# Patient Record
Sex: Female | Born: 1960 | Race: White | Hispanic: No | Smoking: Former smoker
Health system: Southern US, Community
[De-identification: ages and names within clinical notes are randomized; demographics above are authoritative.]

## PROBLEM LIST (undated history)

## (undated) DIAGNOSIS — K219 Gastro-esophageal reflux disease without esophagitis: Secondary | ICD-10-CM

## (undated) DIAGNOSIS — M25569 Pain in unspecified knee: Secondary | ICD-10-CM

## (undated) DIAGNOSIS — K589 Irritable bowel syndrome without diarrhea: Secondary | ICD-10-CM

## (undated) DIAGNOSIS — Q8500 Neurofibromatosis, unspecified: Secondary | ICD-10-CM

## (undated) DIAGNOSIS — N390 Urinary tract infection, site not specified: Secondary | ICD-10-CM

## (undated) HISTORY — DX: Irritable bowel syndrome, unspecified: K58.9

## (undated) HISTORY — PX: BREAST SURGERY: SHX581

## (undated) HISTORY — DX: Gastro-esophageal reflux disease without esophagitis: K21.9

## (undated) HISTORY — DX: Pain in unspecified knee: M25.569

## (undated) HISTORY — PX: ABDOMINAL HYSTERECTOMY: SHX81

---

## 2005-11-16 HISTORY — PX: CHOLECYSTECTOMY: SHX55

## 2007-11-17 HISTORY — PX: BREAST LUMPECTOMY: SHX2

## 2012-09-22 ENCOUNTER — Emergency Department (HOSPITAL_COMMUNITY)
Admission: EM | Admit: 2012-09-22 | Discharge: 2012-09-22 | Discharge status: Against Medical Advice | Payer: Medicaid - Out of State | Attending: Emergency Medicine | Admitting: Emergency Medicine

## 2012-09-22 ENCOUNTER — Emergency Department (HOSPITAL_COMMUNITY): Payer: Medicaid - Out of State

## 2012-09-22 ENCOUNTER — Encounter (HOSPITAL_COMMUNITY): Payer: Self-pay | Admitting: *Deleted

## 2012-09-22 DIAGNOSIS — K56609 Unspecified intestinal obstruction, unspecified as to partial versus complete obstruction: Secondary | ICD-10-CM | POA: Insufficient documentation

## 2012-09-22 DIAGNOSIS — Z8669 Personal history of other diseases of the nervous system and sense organs: Secondary | ICD-10-CM | POA: Insufficient documentation

## 2012-09-22 DIAGNOSIS — F17200 Nicotine dependence, unspecified, uncomplicated: Secondary | ICD-10-CM | POA: Insufficient documentation

## 2012-09-22 HISTORY — DX: Neurofibromatosis, unspecified: Q85.00

## 2012-09-22 LAB — COMPREHENSIVE METABOLIC PANEL
ALT: 16 U/L (ref 0–35)
AST: 16 U/L (ref 0–37)
Albumin: 3.7 g/dL (ref 3.5–5.2)
Alkaline Phosphatase: 81 U/L (ref 39–117)
CO2: 29 mEq/L (ref 19–32)
Chloride: 102 mEq/L (ref 96–112)
GFR calc non Af Amer: >90 mL/min (ref >90)
Potassium: 4 mEq/L (ref 3.5–5.1)
Sodium: 140 mEq/L (ref 135–145)
Total Bilirubin: 0.6 mg/dL (ref 0.3–1.2)

## 2012-09-22 LAB — URINALYSIS, ROUTINE W REFLEX MICROSCOPIC
Glucose, UA: NEGATIVE mg/dL
Hgb urine dipstick: NEGATIVE
Ketones, ur: NEGATIVE mg/dL
Leukocytes, UA: NEGATIVE
Protein, ur: NEGATIVE mg/dL
pH: 5.5 (ref 5.0–8.0)

## 2012-09-22 LAB — CBC WITH DIFFERENTIAL/PLATELET
Basophils Absolute: 0.1 10*3/uL (ref 0.0–0.1)
Basophils Relative: 1 % (ref 0–1)
HCT: 39.7 % (ref 36.0–46.0)
Lymphocytes Relative: 13 % (ref 12–46)
Monocytes Absolute: 0.6 10*3/uL (ref 0.1–1.0)
Neutro Abs: 5.3 10*3/uL (ref 1.7–7.7)
Neutrophils Relative %: 76 % (ref 43–77)
RDW: 13.8 % (ref 11.5–15.5)
WBC: 6.9 10*3/uL (ref 4.0–10.5)

## 2012-09-22 MED ORDER — MORPHINE SULFATE 4 MG/ML IJ SOLN
4.0000 mg | Freq: Once | INTRAMUSCULAR | Status: DC
  Filled 2012-09-22: qty 1

## 2012-09-22 MED ORDER — IOHEXOL 300 MG/ML  SOLN
100.0000 mL | Freq: Once | INTRAMUSCULAR | Status: AC | PRN
  Administered 2012-09-22: 100 mL via INTRAVENOUS

## 2012-09-22 MED ORDER — SODIUM CHLORIDE 0.9 % IV BOLUS (SEPSIS)
1000.0000 mL | Freq: Once | INTRAVENOUS | Status: AC
  Administered 2012-09-22: 1000 mL via INTRAVENOUS

## 2012-09-22 MED ORDER — ONDANSETRON HCL 4 MG/2ML IJ SOLN
4.0000 mg | Freq: Once | INTRAMUSCULAR | Status: AC
  Administered 2012-09-22: 4 mg via INTRAVENOUS
  Filled 2012-09-22: qty 2

## 2012-09-22 NOTE — ED Provider Notes (Signed)
History   This chart was scribed for Kristy Octave, MD by Charolett Bumpers . The patient was seen in room APA07/APA07. Patient's care was started at 1116.   CSN: [id number]  Arrival date & time 09/22/12  1116   First MD Initiated Contact with Patient 09/22/12 1146      Chief Complaint  Patient presents with  . Abdominal Pain    The history is provided by the patient.  Kristy Christian is a 51 y.o. female who presents to the Emergency Department complaining of intermittent, moderate abdominal pain with associated nausea, vomiting and diarrhea that started a few days ago. She describes the abdominal pain as crampy and states she has diarrhea after eating. She also states her urine is dark. She denies any dysuria, back pain, vaginal bleeding or discharge. She reports her abdominal pain feels like when she has a UTI. She reports a h/o abdominal hysterectomy, c-section and cholecystomy. She also has a h/o neurofibromatosis.   Past Medical History  Diagnosis Date  . Neurofibromatosis     Past Surgical History  Procedure Date  . Abdominal hysterectomy   . Breast surgery   . Breast lumpectomy   . Cesarean section   . Cholecystectomy     History reviewed. No pertinent family history.  History  Substance Use Topics  . Smoking status: Current Every Day Smoker  . Smokeless tobacco: Not on file  . Alcohol Use: No    OB History    Grav Para Term Preterm Abortions TAB SAB Ect Mult Living                  Review of Systems A complete 10 system review of systems was obtained and all systems are negative except as noted in the HPI and PMH.   Allergies  Review of patient's allergies indicates no known allergies.  Home Medications  No current outpatient prescriptions on file.  BP 122/69  Pulse 83  Temp 98.3 F (36.8 C) (Oral)  Resp 18  Ht 5\' 5"  (1.651 m)  Wt 175 lb (79.379 kg)  BMI 29.12 kg/m2  SpO2 100%  Physical Exam  Nursing note and vitals  reviewed. Constitutional: She is oriented to person, place, and time. She appears well-developed and well-nourished. No distress.  HENT:  Head: Normocephalic and atraumatic.  Eyes: EOM are normal. Pupils are equal, round, and reactive to light.  Neck: Normal range of motion. Neck supple. No tracheal deviation present.  Cardiovascular: Normal rate.   Pulmonary/Chest: Effort normal. No respiratory distress.  Abdominal: Soft. She exhibits no distension. There is tenderness. There is no rebound and no guarding.       Mild suprapubic tenderness. No CVA tenderness.    Musculoskeletal: Normal range of motion. She exhibits no edema.  Neurological: She is alert and oriented to person, place, and time.  Skin: Skin is warm and dry.       Diffuse fibromas   Psychiatric: She has a normal mood and affect. Her behavior is normal.    ED Course  Procedures (including critical care time)  DIAGNOSTIC STUDIES: Oxygen Saturation is 100% on room air, normal by my interpretation.    COORDINATION OF CARE:  11:50-Discussed planned course of treatment with the patient including IV fluids, pain and nausea medication, a CT of abdomen, blood work and UA, who is agreeable at this time.   12:00-Medication Orders: Sodium chloride 0.9% bolus 1,000 mL-once; Ondansetron (Zofran) injection 4 mg-once; Morphine 4 mg/mL injection 4 mg-once  Labs Reviewed  COMPREHENSIVE METABOLIC PANEL - Abnormal; Notable for the following:    Glucose, Bld 104 (*)     All other components within normal limits  CBC WITH DIFFERENTIAL  LIPASE, BLOOD  URINALYSIS, ROUTINE W REFLEX MICROSCOPIC   Ct Abdomen Pelvis W Contrast  09/22/2012  *RADIOLOGY REPORT*  Clinical Data: Abdominal pain  CT ABDOMEN AND PELVIS WITH CONTRAST  Technique:  Multidetector CT imaging of the abdomen and pelvis was performed following the standard protocol during bolus administration of intravenous contrast.  Contrast: OMNIPAQUE IOHEXOL 300 MG/ML  SOLN   Comparison: None.  Findings: Limited images through the lung bases demonstrate no significant appreciable abnormality. The heart size is within normal limits. No pleural or pericardial effusion.  There is mild intra and extrahepatic biliary ductal dilatation to the level of the ampulla. 11 mm hypodensity  along the vertical portion of the duodenum, inferior to the level of the ampulla. Status post cholecystectomy. Left hepatic lobe atrophy versus surgical change. 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  Otherwise unremarkable liver, spleen, pancreas, adrenal glands. There is a 2 x 1.4 cm area of hypoattenuation  superomedial to the left adrenal gland. This is nonspecific.  Symmetric renal enhancement.  No hydronephrosis or hydroureter.  There is a small bowel obstruction pattern with dilated small bowel loops and air-fluid levels, measuring up to 4.8 cm in diameter. The transition point is at the distal ileum mucosal hyperenhancement and submucosal fat attenuation is noted and (series 2 image 65 for example). Appendix within normal limits. There is anterior abdominal wall laxity however no overt herniation.  Small hiatal hernia. No lymphadenopathy.  There is a small amount of intraperitoneal fluid.  No free intraperitoneal air identified.  Status post hysterectomy.  Nonspecific appearance to the cervix. No adnexal mass.  Numerous cutaneous nodules, in keeping with the history of neurofibromatosis.  No acute osseous finding.  IMPRESSION: Small bowel obstruction with transition at the distal ileum. Etiology of which is uncertain.  The transitioned loop shows mucosal hyperenhancement, therefore may reflect an infectious, inflammatory, or ischemic process. Underlying mass not excluded.  Dilated intra and extrahepatic bile ducts to the level of the ampulla.  This is a nonspecific finding. There is an oval hypoattenuation along the second portion of the duodenum, which may  reflect a mucosal or submucosal cystic or fat containing lesion. Recommend comparison with prior imaging to document stability. Can be further evaluated with liver MRI / MRCP.  Atrophy versus postoperative change of the left hepatic lobe.  Cutaneous neurofibromatosis.  2 x 1.4 cm area of hypoattenuation superomedial to the left adrenal gland is nonspecific.   Original Report Authenticated By: Jearld Lesch, M.D.      No diagnosis found.    MDM  Patient presents with lower bowel pain, nausea, vomiting and diarrhea since yesterday. She reports history of UTI without symptoms.  Urinalysis negative, abdomen soft with minimal suprapubic tenderness.  CT shows possible SBO.  Uncertain in setting of neurofibromatosis.  PAtient states she had a bowel obstruction several years ago in [place], that got better on its own. She is unwilling to stay in the hospital.  D/w Dr. Lovell Sheehan.  Patient states she doesn't want to talk to him and just wants to go home because her kids are in town.  She understands that she is leaving against medical advice.   I personally performed the services described in this documentation, which was scribed in my presence.  The recorded information  has been reviewed and considered.      Kristy Octave, MD 09/22/12 1537

## 2012-09-22 NOTE — ED Notes (Signed)
Pt stated she drove herself to the ed. And is unable to take the morphine.   meds held at this time.   Pt in agreement.  Stated she was also unable to drink 2nd bottle of contrast. Ct notified.

## 2012-09-22 NOTE — ED Notes (Signed)
abd pain, diarrhea,nausea, no vomiting.

## 2012-09-22 NOTE — ED Notes (Signed)
Pt refusing to be admitted to hospital, pt states " I need to see my kids, I haven't seen them since April". The ramifications of refusing medical treatment and admission explained to pt, pt expressed understanding, AMA form explained to and signed by pt.

## 2012-09-26 ENCOUNTER — Emergency Department (HOSPITAL_COMMUNITY): Payer: Medicaid - Out of State

## 2012-09-26 ENCOUNTER — Encounter (HOSPITAL_COMMUNITY): Payer: Self-pay

## 2012-09-26 ENCOUNTER — Inpatient Hospital Stay (HOSPITAL_COMMUNITY)
Admission: EM | Admit: 2012-09-26 | Discharge: 2012-09-27 | DRG: 390 | Disposition: A | Discharge status: Home / Self Care | Payer: Medicaid - Out of State | Attending: General Surgery | Admitting: General Surgery

## 2012-09-26 DIAGNOSIS — Q8500 Neurofibromatosis, unspecified: Secondary | ICD-10-CM | POA: Diagnosis present

## 2012-09-26 DIAGNOSIS — K56609 Unspecified intestinal obstruction, unspecified as to partial versus complete obstruction: Secondary | ICD-10-CM | POA: Diagnosis not present

## 2012-09-26 DIAGNOSIS — Z90710 Acquired absence of both cervix and uterus: Secondary | ICD-10-CM

## 2012-09-26 DIAGNOSIS — F17200 Nicotine dependence, unspecified, uncomplicated: Secondary | ICD-10-CM | POA: Diagnosis present

## 2012-09-26 DIAGNOSIS — R109 Unspecified abdominal pain: Secondary | ICD-10-CM | POA: Diagnosis not present

## 2012-09-26 LAB — URINALYSIS, ROUTINE W REFLEX MICROSCOPIC
Glucose, UA: NEGATIVE mg/dL
Ketones, ur: 40 mg/dL — AB
pH: 5.5 (ref 5.0–8.0)

## 2012-09-26 LAB — BASIC METABOLIC PANEL
BUN: 18 mg/dL (ref 6–23)
CO2: 31 mEq/L (ref 19–32)
Calcium: 9 mg/dL (ref 8.4–10.5)
Chloride: 97 mEq/L (ref 96–112)
Creatinine, Ser: 0.72 mg/dL (ref 0.50–1.10)

## 2012-09-26 LAB — CBC WITH DIFFERENTIAL/PLATELET
Basophils Absolute: 0.1 10*3/uL (ref 0.0–0.1)
Basophils Relative: 1 % (ref 0–1)
Eosinophils Relative: 1 % (ref 0–5)
HCT: 44.7 % (ref 36.0–46.0)
Hemoglobin: 16.2 g/dL — ABNORMAL HIGH (ref 12.0–15.0)
Lymphocytes Relative: 13 % (ref 12–46)
MCHC: 36.2 g/dL — ABNORMAL HIGH (ref 30.0–36.0)
MCV: 81.4 fL (ref 78.0–100.0)
Monocytes Absolute: 1 10*3/uL (ref 0.1–1.0)
Monocytes Relative: 11 % (ref 3–12)
Neutro Abs: 6.7 10*3/uL (ref 1.7–7.7)
RDW: 13.4 % (ref 11.5–15.5)

## 2012-09-26 MED ORDER — ONDANSETRON HCL 4 MG/2ML IJ SOLN: 4.0000 mg | Freq: Four times a day (QID) | INTRAMUSCULAR | Status: DC | PRN

## 2012-09-26 MED ORDER — LACTATED RINGERS IV SOLN
INTRAVENOUS | Status: DC
  Administered 2012-09-26 – 2012-09-27 (×4): via INTRAVENOUS

## 2012-09-26 MED ORDER — ENOXAPARIN SODIUM 40 MG/0.4ML ~~LOC~~ SOLN
40.0000 mg | SUBCUTANEOUS | Status: DC
  Administered 2012-09-27: 40 mg via SUBCUTANEOUS
  Filled 2012-09-26: qty 0.4

## 2012-09-26 MED ORDER — PANTOPRAZOLE SODIUM 40 MG IV SOLR
40.0000 mg | Freq: Every day | INTRAVENOUS | Status: DC
  Administered 2012-09-26: 40 mg via INTRAVENOUS
  Filled 2012-09-26: qty 40

## 2012-09-26 MED ORDER — MORPHINE SULFATE 2 MG/ML IJ SOLN: 1.0000 mg | INTRAMUSCULAR | Status: DC | PRN

## 2012-09-26 MED ORDER — SODIUM CHLORIDE 0.9 % IV SOLN
INTRAVENOUS | Status: DC
  Administered 2012-09-26: 13:00:00 via INTRAVENOUS

## 2012-09-26 MED ORDER — SODIUM CHLORIDE 0.9 % IV BOLUS (SEPSIS)
1000.0000 mL | Freq: Once | INTRAVENOUS | Status: AC
  Administered 2012-09-26: 1000 mL via INTRAVENOUS

## 2012-09-26 NOTE — ED Notes (Signed)
Bed control called and advised that they are "working" on the bed,

## 2012-09-26 NOTE — ED Notes (Signed)
Pt requesting something to drink, Per Dr. Adriana Simas pt is to remain NPO until seen by admitting dr. Pt advised of NPO status and expressed understanding.

## 2012-09-26 NOTE — ED Provider Notes (Signed)
History   This chart was scribed for Donnetta Hutching, MD by Charolett Bumpers, ER Scribe. The patient was seen in room APA15/APA15. Patient's care was started at 0904.   CSN: [id number]  Arrival date & time 09/26/12  4098   First MD Initiated Contact with Patient 09/26/12 [phone]      Chief Complaint  Patient presents with  . Abdominal Pain     The history is provided by the patient. No language interpreter was used.   Kristy Christian is a 51 y.o. female who presents to the Emergency Department complaining of intermittent abdominal pain for the past 2 weeks. She reports that she was seen on 09/22/12, a CT was performed that showed a small bowel obstruction in which she left AMA due to family commitments. She reports minimal abdominal discomfort and states she believes it is due to gas. She states that her last BM consisted of loose stool yesterday. She reports associated nausea and states she is able to keep down crackers and liquids. She states that she had one episode of emesis over the weekend. She denies any urinary or vaginal symptoms. She reports a surgical h/o 2 c-sections and an abdominal hysterectomy, but denies any h/o surgical removal of internal fibromas.   Past Medical History  Diagnosis Date  . Neurofibromatosis     Past Surgical History  Procedure Date  . Abdominal hysterectomy   . Breast surgery   . Breast lumpectomy   . Cesarean section   . Cholecystectomy     No family history on file.  History  Substance Use Topics  . Smoking status: Current Every Day Smoker    Types: Cigarettes  . Smokeless tobacco: Not on file  . Alcohol Use: No    OB History    Grav Para Term Preterm Abortions TAB SAB Ect Mult Living                  Review of Systems A complete 10 system review of systems was obtained and all systems are negative except as noted in the HPI and PMH.   Allergies  Review of patient's allergies indicates no known allergies.  Home Medications    Current Outpatient Rx  Name  Route  Sig  Dispense  Refill  . LUBIPROSTONE 24 MCG PO CAPS   Oral   Take 24 mcg by mouth 2 (two) times daily.         Marland Kitchen OMEPRAZOLE MAGNESIUM 20 MG PO TBEC   Oral   Take 20 mg by mouth daily.           BP 116/74  Pulse 120  Temp 98.3 F (36.8 C) (Oral)  Resp 20  SpO2 100%  Physical Exam  Nursing note and vitals reviewed. Constitutional: She is oriented to person, place, and time. She appears well-developed and well-nourished. No distress.  HENT:  Head: Normocephalic and atraumatic.  Eyes: Conjunctivae normal and EOM are normal. Pupils are equal, round, and reactive to light.  Neck: Normal range of motion. Neck supple. No tracheal deviation present.  Cardiovascular: Normal rate, regular rhythm and normal heart sounds.   No murmur heard. Pulmonary/Chest: Effort normal and breath sounds normal. No respiratory distress. She has no wheezes.  Abdominal: Soft. Bowel sounds are normal. She exhibits no distension. There is no tenderness. There is no rebound and no guarding.  Musculoskeletal: Normal range of motion. She exhibits no edema.  Neurological: She is alert and oriented to person, place, and time.  Skin: Skin  is warm and dry.       Diffuse fibromas.   Psychiatric: She has a normal mood and affect. Her behavior is normal.    ED Course  Procedures (including critical care time)  DIAGNOSTIC STUDIES: Oxygen Saturation is 100% on room air, normal by my interpretation.    COORDINATION OF CARE:  09:18-Discussed planned course of treatment with the patient an acute abdominal series, who is agreeable at this time.   10:15-Recheck: Informed pt of x-ray findings of a small bowel obstruction. She reports a h/o small bowel obstruction and states she normally is d/c home. Discussed admission with pt and she is agreeable to plan at this time. Will order IV fluids and labs.   Results for orders placed during the hospital encounter of 09/26/12  CBC  WITH DIFFERENTIAL      Component Value Range   WBC 9.1  4.0 - 10.5 K/uL   RBC 5.49 (*) 3.87 - 5.11 MIL/uL   Hemoglobin 16.2 (*) 12.0 - 15.0 g/dL   HCT 16.1  09.6 - 04.5 %   MCV 81.4  78.0 - 100.0 fL   MCH 29.5  26.0 - 34.0 pg   MCHC 36.2 (*) 30.0 - 36.0 g/dL   RDW 40.9  81.1 - 91.4 %   Platelets 246  150 - 400 K/uL   Neutrophils Relative 74  43 - 77 %   Neutro Abs 6.7  1.7 - 7.7 K/uL   Lymphocytes Relative 13  12 - 46 %   Lymphs Abs 1.2  0.7 - 4.0 K/uL   Monocytes Relative 11  3 - 12 %   Monocytes Absolute 1.0  0.1 - 1.0 K/uL   Eosinophils Relative 1  0 - 5 %   Eosinophils Absolute 0.1  0.0 - 0.7 K/uL   Basophils Relative 1  0 - 1 %   Basophils Absolute 0.1  0.0 - 0.1 K/uL  BASIC METABOLIC PANEL      Component Value Range   Sodium 140  135 - 145 mEq/L   Potassium 3.3 (*) 3.5 - 5.1 mEq/L   Chloride 97  96 - 112 mEq/L   CO2 31  19 - 32 mEq/L   Glucose, Bld 92  70 - 99 mg/dL   BUN 18  6 - 23 mg/dL   Creatinine, Ser 7.82  0.50 - 1.10 mg/dL   Calcium 9.0  8.4 - 95.6 mg/dL   GFR calc non Af Amer >90  >90 mL/min   GFR calc Af Amer >90  >90 mL/min  URINALYSIS, ROUTINE W REFLEX MICROSCOPIC      Component Value Range   Color, Urine YELLOW  YELLOW   APPearance CLEAR  CLEAR   Specific Gravity, Urine >1.030 (*) 1.005 - 1.030   pH 5.5  5.0 - 8.0   Glucose, UA NEGATIVE  NEGATIVE mg/dL   Hgb urine dipstick NEGATIVE  NEGATIVE   Bilirubin Urine MODERATE (*) NEGATIVE   Ketones, ur 40 (*) NEGATIVE mg/dL   Protein, ur TRACE (*) NEGATIVE mg/dL   Urobilinogen, UA 0.2  0.0 - 1.0 mg/dL   Nitrite NEGATIVE  NEGATIVE   Leukocytes, UA NEGATIVE  NEGATIVE  URINE MICROSCOPIC-ADD ON      Component Value Range   WBC, UA 0-2  <3 WBC/hpf   RBC / HPF 0-2  <3 RBC/hpf    Dg Abd Acute W/chest  09/26/2012  *RADIOLOGY REPORT*  Clinical Data: Abdominal pain.  Evaluate for small bowel obstruction.  ACUTE ABDOMEN SERIES (ABDOMEN 2  VIEW & CHEST 1 VIEW)  Comparison: CT of the abdomen and pelvis 09/22/2012.   Findings:  Numerous nodular densities project over the lungs bilaterally, however, these likely reflect skin lesions (likely numerous neurofibromas).  No acute consolidative airspace disease.  No pleural effusions.  Pulmonary vasculature and the cardiomediastinal silhouette are within normal limits.  Orthopedic fixation hardware in the lower cervical spine is incidentally noted.  Supine and upright views of the abdomen demonstrates no appreciable colonic gas.  There are numerous dilated loops of small bowel measuring up to 4.2 cm in diameter.  Air fluid levels are noted on the upright projection.  No gross evidence of pneumoperitoneum at this time. Numerous nodular densities are seen projecting over the abdomen and pelvis, also likely represent neurofibromas. Surgical clips project over the right upper quadrant of the abdomen, compatible with prior cholecystectomy.  IMPRESSION: 1.  Bowel gas pattern is compatible with a small bowel obstruction. 2.  No pneumoperitoneum at this time. 3.  Status post cholecystectomy. 4.  No radiographic evidence of acute cardiopulmonary disease.   Original Report Authenticated By: Trudie Reed, M.D.     No diagnosis found.    MDM  History and physical consistent with small bowel obstruction. CT scan on Friday confirms same. Three-way abdomen today shows persistent obstruction. Admit to general surgery. No acute abdomen.   I personally performed the services described in this documentation, which was scribed in my presence. The recorded information has been reviewed and is accurate.      Donnetta Hutching, MD 09/26/12 [phone]

## 2012-09-26 NOTE — ED Notes (Signed)
Pt returns to er for recheck of abd pain, pt was seen in er on Friday, diagnosed with small bowel obstruction, signed out ama due to family commitments, pt states that she is feeling better, last bowel movement yesterday, "some" n/v last night but none today,

## 2012-09-26 NOTE — ED Notes (Signed)
Pt reports that she has been sick off/on for 2 weeks, pain comes and goes. Was told Friday she had small bowel obstruction. Signed self out ama. Returning today because of cont. "gas pain", +nausea at times. Last bm yesterday.  No vomiting.

## 2012-09-26 NOTE — ED Notes (Signed)
Report given to Shon Hale, RN on 300

## 2012-09-26 NOTE — ED Notes (Signed)
Dr. Adriana Simas speaking with pt

## 2012-09-27 LAB — CBC
HCT: 36.2 % (ref 36.0–46.0)
Hemoglobin: 12.9 g/dL (ref 12.0–15.0)
RBC: 4.41 MIL/uL (ref 3.87–5.11)
WBC: 5.8 10*3/uL (ref 4.0–10.5)

## 2012-09-27 LAB — BASIC METABOLIC PANEL
BUN: 18 mg/dL (ref 6–23)
Chloride: 103 mEq/L (ref 96–112)
GFR calc Af Amer: >90 mL/min (ref >90)
Potassium: 3 mEq/L — ABNORMAL LOW (ref 3.5–5.1)

## 2012-09-27 NOTE — H&P (Signed)
Kristy Christian is an 51 y.o. female.   Chief Complaint: Nausea vomiting abdominal pain. HPI: Patient actually presented several days ago with similar symptomatology. She was worked up and was suspected to have a small bowel obstruction. Due to family issues patient was not able to stay and did not pursue medical care at that time. She returns with continued symptomatology. Continue nausea and vomiting. Her last bowel movement has been several days ago. No fevers and chills. No hematemesis. No melena or hematochezia. No similar symptomatology in the past.  Past Medical History  Diagnosis Date  . Neurofibromatosis     Past Surgical History  Procedure Date  . Abdominal hysterectomy   . Breast surgery   . Breast lumpectomy   . Cesarean section   . Cholecystectomy     No family history on file. Social History:  reports that she has been smoking Cigarettes.  She does not have any smokeless tobacco history on file. She reports that she does not drink alcohol or use illicit drugs.  Allergies: No Known Allergies  Medications Prior to Admission  Medication Sig Dispense Refill  . Cyanocobalamin (VITAMIN B 12 PO) Take 1 tablet by mouth daily.      Marland Kitchen lubiprostone (AMITIZA) 24 MCG capsule Take 24 mcg by mouth 2 (two) times daily.      . Multiple Vitamin (MULTIVITAMIN WITH MINERALS) TABS Take 1 tablet by mouth daily.      Marland Kitchen omeprazole (PRILOSEC OTC) 20 MG tablet Take 20 mg by mouth daily.        Results for orders placed during the hospital encounter of 09/26/12 (from the past 48 hour(s))  URINALYSIS, ROUTINE W REFLEX MICROSCOPIC     Status: Abnormal   Collection Time   09/26/12 10:51 AM      Component Value Range Comment   Color, Urine YELLOW  YELLOW    APPearance CLEAR  CLEAR    Specific Gravity, Urine >1.030 (*) 1.005 - 1.030    pH 5.5  5.0 - 8.0    Glucose, UA NEGATIVE  NEGATIVE mg/dL    Hgb urine dipstick NEGATIVE  NEGATIVE    Bilirubin Urine MODERATE (*) NEGATIVE    Ketones, ur 40 (*)  NEGATIVE mg/dL    Protein, ur TRACE (*) NEGATIVE mg/dL    Urobilinogen, UA 0.2  0.0 - 1.0 mg/dL    Nitrite NEGATIVE  NEGATIVE    Leukocytes, UA NEGATIVE  NEGATIVE   URINE MICROSCOPIC-ADD ON     Status: Normal   Collection Time   09/26/12 10:51 AM      Component Value Range Comment   WBC, UA 0-2  <3 WBC/hpf    RBC / HPF 0-2  <3 RBC/hpf   CBC WITH DIFFERENTIAL     Status: Abnormal   Collection Time   09/26/12 10:53 AM      Component Value Range Comment   WBC 9.1  4.0 - 10.5 K/uL    RBC 5.49 (*) 3.87 - 5.11 MIL/uL    Hemoglobin 16.2 (*) 12.0 - 15.0 g/dL    HCT 47.8  29.5 - 62.1 %    MCV 81.4  78.0 - 100.0 fL    MCH 29.5  26.0 - 34.0 pg    MCHC 36.2 (*) 30.0 - 36.0 g/dL    RDW 30.8  65.7 - 84.6 %    Platelets 246  150 - 400 K/uL    Neutrophils Relative 74  43 - 77 %    Neutro Abs 6.7  1.7 - 7.7 K/uL    Lymphocytes Relative 13  12 - 46 %    Lymphs Abs 1.2  0.7 - 4.0 K/uL    Monocytes Relative 11  3 - 12 %    Monocytes Absolute 1.0  0.1 - 1.0 K/uL    Eosinophils Relative 1  0 - 5 %    Eosinophils Absolute 0.1  0.0 - 0.7 K/uL    Basophils Relative 1  0 - 1 %    Basophils Absolute 0.1  0.0 - 0.1 K/uL   BASIC METABOLIC PANEL     Status: Abnormal   Collection Time   09/26/12 10:53 AM      Component Value Range Comment   Sodium 140  135 - 145 mEq/L    Potassium 3.3 (*) 3.5 - 5.1 mEq/L    Chloride 97  96 - 112 mEq/L    CO2 31  19 - 32 mEq/L    Glucose, Bld 92  70 - 99 mg/dL    BUN 18  6 - 23 mg/dL    Creatinine, Ser 4.09  0.50 - 1.10 mg/dL    Calcium 9.0  8.4 - 81.1 mg/dL    GFR calc non Af Amer >90  >90 mL/min    GFR calc Af Amer >90  >90 mL/min   BASIC METABOLIC PANEL     Status: Abnormal   Collection Time   09/27/12  4:48 AM      Component Value Range Comment   Sodium 140  135 - 145 mEq/L    Potassium 3.0 (*) 3.5 - 5.1 mEq/L    Chloride 103  96 - 112 mEq/L    CO2 27  19 - 32 mEq/L    Glucose, Bld 80  70 - 99 mg/dL    BUN 18  6 - 23 mg/dL    Creatinine, Ser 9.14  0.50  - 1.10 mg/dL    Calcium 8.2 (*) 8.4 - 10.5 mg/dL    GFR calc non Af Amer >90  >90 mL/min    GFR calc Af Amer >90  >90 mL/min   CBC     Status: Normal   Collection Time   09/27/12  4:48 AM      Component Value Range Comment   WBC 5.8  4.0 - 10.5 K/uL    RBC 4.41  3.87 - 5.11 MIL/uL    Hemoglobin 12.9  12.0 - 15.0 g/dL    HCT 78.2  95.6 - 21.3 %    MCV 82.1  78.0 - 100.0 fL    MCH 29.3  26.0 - 34.0 pg    MCHC 35.6  30.0 - 36.0 g/dL    RDW 08.6  57.8 - 46.9 %    Platelets 202  150 - 400 K/uL    Dg Abd Acute W/chest  09/26/2012  *RADIOLOGY REPORT*  Clinical Data: Abdominal pain.  Evaluate for small bowel obstruction.  ACUTE ABDOMEN SERIES (ABDOMEN 2 VIEW & CHEST 1 VIEW)  Comparison: CT of the abdomen and pelvis 09/22/2012.  Findings:  Numerous nodular densities project over the lungs bilaterally, however, these likely reflect skin lesions (likely numerous neurofibromas).  No acute consolidative airspace disease.  No pleural effusions.  Pulmonary vasculature and the cardiomediastinal silhouette are within normal limits.  Orthopedic fixation hardware in the lower cervical spine is incidentally noted.  Supine and upright views of the abdomen demonstrates no appreciable colonic gas.  There are numerous dilated loops of small bowel measuring up to 4.2 cm  in diameter.  Air fluid levels are noted on the upright projection.  No gross evidence of pneumoperitoneum at this time. Numerous nodular densities are seen projecting over the abdomen and pelvis, also likely represent neurofibromas. Surgical clips project over the right upper quadrant of the abdomen, compatible with prior cholecystectomy.  IMPRESSION: 1.  Bowel gas pattern is compatible with a small bowel obstruction. 2.  No pneumoperitoneum at this time. 3.  Status post cholecystectomy. 4.  No radiographic evidence of acute cardiopulmonary disease.   Original Report Authenticated By: Trudie Reed, M.D.     Review of Systems  Constitutional:  Negative.   HENT: Negative.   Eyes: Negative.   Respiratory: Negative.   Cardiovascular: Negative.   Gastrointestinal: Positive for heartburn, nausea, vomiting and abdominal pain. Negative for diarrhea, constipation, blood in stool and melena.  Genitourinary: Negative.   Musculoskeletal: Negative.   Skin: Negative.   Neurological: Negative.   Endo/Heme/Allergies: Negative.   Psychiatric/Behavioral: Negative.     Blood pressure 137/67, pulse 85, temperature 97.9 F (36.6 C), temperature source Oral, resp. rate 18, SpO2 96.00%. Physical Exam  Constitutional: She is oriented to person, place, and time. She appears well-developed and well-nourished. No distress.  HENT:  Head: Normocephalic and atraumatic.  Eyes: Conjunctivae normal and EOM are normal. Pupils are equal, round, and reactive to light. No scleral icterus.  Neck: Normal range of motion. Neck supple. No tracheal deviation present. No thyromegaly present.  Cardiovascular: Normal rate, regular rhythm and normal heart sounds.   Respiratory: Effort normal and breath sounds normal. No respiratory distress. She has no wheezes.  GI: Soft. Bowel sounds are normal. She exhibits no distension and no mass. There is no tenderness. There is no rebound and no guarding.  Musculoskeletal: Normal range of motion.  Lymphadenopathy:    She has no cervical adenopathy.  Neurological: She is alert and oriented to person, place, and time.  Skin: Skin is warm and dry.       Diffuse skin nodules.     Assessment/Plan Small bowel obstruction. Neurofibromatosis. At this time patient will be admitted for continued medical care. Continue conservative management. Continue IV fluid hydration. Bowel rest. Surgical indications were discussed with the patient. Possible etiologies were discussed with the patient. Given her history of pelvic surgery this is still the most likely etiology however given her neurofibromatosis a small bowel tumor even benign may be  resolving and her symptomatology. This can be pursued should she have resolution of her symptoms. If patient does demonstrate resolution of the bowel obstruction Will advance on a diet as tolerated.  Alvaro Aungst C 09/27/2012, 2:35 PM

## 2012-09-27 NOTE — Progress Notes (Signed)
Patient received discharge instructions along with follow up appointments. Patient verbalized understanding of all instructions. Patient was escorted by staff via ambulation to vehicle. Patient discharged to home in stable condition.

## 2012-09-27 NOTE — Progress Notes (Signed)
UR Chart Review Completed  

## 2012-09-27 NOTE — Care Management Note (Unsigned)
    Page 1 of 1   09/27/2012     12:04:08 PM   CARE MANAGEMENT NOTE 09/27/2012  Patient:  Kristy, Christian   Account Number:  [id number]  Date Initiated:  09/27/2012  Documentation initiated by:  Kristy Christian  Subjective/Objective Assessment:   Pt admitted from home with SBO. Pt lives alone in low income housing apartments. Pt is followed by the [facility]. Pt is independent with ADL's.     Action/Plan:   WIll assist pt with obtaining a PCP prior to discharge. No HH needs noted.   Anticipated DC Date:  09/29/2012   Anticipated DC Plan:  HOME/SELF CARE      DC Planning Services  CM consult      Choice offered to / List presented to:             Status of service:  In process, will continue to follow Medicare Important Message given?   (If response is "NO", the following Medicare IM given date fields will be blank) Date Medicare IM given:   Date Additional Medicare IM given:    Discharge Disposition:    Per UR Regulation:    If discussed at Long Length of Stay Meetings, dates discussed:    Comments:  09/27/12 1202 Kristy Queen, RN BSN CM

## 2012-09-28 LAB — URINE CULTURE
Colony Count: NO GROWTH
Culture: NO GROWTH

## 2012-10-19 NOTE — Discharge Summary (Signed)
Physician Discharge Summary  Patient ID: Kristy Christian MRN: [id number] DOB/AGE: [birth year] 51 y.o.  Admit date: 09/26/2012 Discharge date: 09/27/2012  Admission Diagnoses: Small bowel obstruction  Discharge Diagnoses: The same Active Problems:  * No active hospital problems. *    Discharged Condition: stable  Hospital Course: Patient was admitted for nausea vomiting and increasing abdominal pain. Evaluation had been consistent for a small bowel obstruction versus a partial small bowel obstruction. During his hospitalization his symptomatology continue to improve. His nausea improved. He was noted to have a large bowel movement he was advanced on his diet. He is tolerating a regular diet with no difficulty. With resolution of the apparent small bowel obstruction the patient was made ready for discharge.  Consults: None  Significant Diagnostic Studies: radiology: CT scan: Abdomen pelvis  Treatments: IV hydration and analgesia: Dilaudid  Discharge Exam: Blood pressure 137/67, pulse 85, temperature 97.9 F (36.6 C), temperature source Oral, resp. rate 18, height 5\' 5"  (1.651 m), weight 79.379 kg (175 lb), SpO2 96.00%. General appearance: alert and no distress Resp: clear to auscultation bilaterally Cardio: regular rate and rhythm GI: soft, non-tender; bowel sounds normal; no masses,  no organomegaly  Disposition: 01-Home or Self Care  Discharge Orders    Future Orders Please Complete By Expires   Diet - low sodium heart healthy      Increase activity slowly      Discharge instructions      Comments:   Low residual diet as discussed.  Increase activity as tolerated.   Call MD for:  temperature >100.4      Call MD for:  persistant nausea and vomiting      Call MD for:  severe uncontrolled pain      Call MD for:  redness, tenderness, or signs of infection (pain, swelling, redness, odor or green/yellow discharge around incision site)          Medication List     As of  10/19/2012  8:46 AM    TAKE these medications         lubiprostone 24 MCG capsule   Commonly known as: AMITIZA   Take 24 mcg by mouth 2 (two) times daily.      multivitamin with minerals Tabs   Take 1 tablet by mouth daily.      omeprazole 20 MG tablet   Commonly known as: PRILOSEC OTC   Take 20 mg by mouth daily.      VITAMIN B 12 PO   Take 1 tablet by mouth daily.           Follow-up Information    Follow up with Fabio Bering, MD. (As needed)    Contact information:   [address] [place] [address] [phone]          Signed: Fabio Bering 10/19/2012, 8:46 AM

## 2012-10-27 ENCOUNTER — Emergency Department (HOSPITAL_COMMUNITY)
Admission: EM | Admit: 2012-10-27 | Discharge: 2012-10-27 | Disposition: A | Discharge status: Home / Self Care | Payer: Medicaid - Out of State | Attending: Emergency Medicine | Admitting: Emergency Medicine

## 2012-10-27 ENCOUNTER — Encounter (HOSPITAL_COMMUNITY): Payer: Self-pay | Admitting: Emergency Medicine

## 2012-10-27 DIAGNOSIS — R300 Dysuria: Secondary | ICD-10-CM | POA: Insufficient documentation

## 2012-10-27 DIAGNOSIS — F17200 Nicotine dependence, unspecified, uncomplicated: Secondary | ICD-10-CM | POA: Insufficient documentation

## 2012-10-27 DIAGNOSIS — Z8669 Personal history of other diseases of the nervous system and sense organs: Secondary | ICD-10-CM | POA: Insufficient documentation

## 2012-10-27 DIAGNOSIS — M545 Low back pain, unspecified: Secondary | ICD-10-CM | POA: Insufficient documentation

## 2012-10-27 DIAGNOSIS — R350 Frequency of micturition: Secondary | ICD-10-CM | POA: Insufficient documentation

## 2012-10-27 DIAGNOSIS — N390 Urinary tract infection, site not specified: Secondary | ICD-10-CM | POA: Insufficient documentation

## 2012-10-27 DIAGNOSIS — Z79899 Other long term (current) drug therapy: Secondary | ICD-10-CM | POA: Insufficient documentation

## 2012-10-27 LAB — URINALYSIS, ROUTINE W REFLEX MICROSCOPIC
Glucose, UA: NEGATIVE mg/dL
Leukocytes, UA: NEGATIVE
Nitrite: POSITIVE — AB
Protein, ur: NEGATIVE mg/dL
Urobilinogen, UA: 1 mg/dL (ref 0.0–1.0)

## 2012-10-27 LAB — URINE MICROSCOPIC-ADD ON

## 2012-10-27 MED ORDER — SULFAMETHOXAZOLE-TRIMETHOPRIM 800-160 MG PO TABS: 1.0000 | ORAL_TABLET | Freq: Two times a day (BID) | ORAL | Status: DC

## 2012-10-27 MED ORDER — PHENAZOPYRIDINE HCL 200 MG PO TABS: 200.0000 mg | ORAL_TABLET | Freq: Three times a day (TID) | ORAL | Status: DC

## 2012-10-27 MED ORDER — FLUCONAZOLE 150 MG PO TABS: 150.0000 mg | ORAL_TABLET | Freq: Every day | ORAL | Status: AC

## 2012-10-27 NOTE — ED Notes (Signed)
Pt states UTI symptoms for several days. Pt also states has symptoms of itching like she has a yeast infection.

## 2012-10-29 LAB — URINE CULTURE

## 2012-10-30 NOTE — ED Notes (Signed)
+  Urine. Patient treated with Septra DS. Sensitive to same. Per protocol MD. °

## 2012-10-30 NOTE — ED Provider Notes (Signed)
Medical screening examination/treatment/procedure(s) were performed by non-physician practitioner and as supervising physician I was immediately available for consultation/collaboration. Devoria Albe, MD, FACEP   Ward Givens, MD 10/30/12 [phone]

## 2012-10-30 NOTE — ED Provider Notes (Signed)
History     CSN: [id number]  Arrival date & time 10/27/12  1324   First MD Initiated Contact with Patient 10/27/12 1513      Chief Complaint  Patient presents with  . Urinary Tract Infection    (Consider location/radiation/quality/duration/timing/severity/associated sxs/prior treatment) HPI Comments: Kristy Christian presents with a 2 day history of increased urinary frequency and burning pain with urination along with low back pain.  She has a history of frequent uti's, her last infection occurred 2/13.  She denies fevers, nausea, vomiting and abdominal pain.  She denies vaginal discharge,  But states she is susceptible to yeast infections after antibiotic use.  She has taken no medications or treatments but has increased her fluid intake.  The history is provided by the patient.    Past Medical History  Diagnosis Date  . Neurofibromatosis     Past Surgical History  Procedure Date  . Abdominal hysterectomy   . Breast surgery   . Breast lumpectomy   . Cesarean section   . Cholecystectomy     History reviewed. No pertinent family history.  History  Substance Use Topics  . Smoking status: Current Every Day Smoker    Types: Cigarettes  . Smokeless tobacco: Not on file  . Alcohol Use: No    OB History    Grav Para Term Preterm Abortions TAB SAB Ect Mult Living                  Review of Systems  Constitutional: Negative for fever.  HENT: Negative for congestion, sore throat and neck pain.   Eyes: Negative.   Respiratory: Negative for chest tightness and shortness of breath.   Cardiovascular: Negative for chest pain.  Gastrointestinal: Negative for nausea and abdominal pain.  Genitourinary: Positive for dysuria and frequency. Negative for vaginal discharge and pelvic pain.  Musculoskeletal: Negative for joint swelling and arthralgias.  Skin: Negative.  Negative for rash and wound.  Neurological: Negative for dizziness, weakness, light-headedness, numbness and  headaches.  Hematological: Negative.   Psychiatric/Behavioral: Negative.     Allergies  Review of patient's allergies indicates no known allergies.  Home Medications   Current Outpatient Rx  Name  Route  Sig  Dispense  Refill  . VITAMIN B 12 PO   Oral   Take 1 tablet by mouth daily.         . LUBIPROSTONE 24 MCG PO CAPS   Oral   Take 24 mcg by mouth 2 (two) times daily.         . ADULT MULTIVITAMIN W/MINERALS CH   Oral   Take 1 tablet by mouth daily.         Marland Kitchen OMEPRAZOLE MAGNESIUM 20 MG PO TBEC   Oral   Take 20 mg by mouth daily.         Marland Kitchen FLUCONAZOLE 150 MG PO TABS   Oral   Take 1 tablet (150 mg total) by mouth daily.   2 tablet   0   . PHENAZOPYRIDINE HCL 200 MG PO TABS   Oral   Take 1 tablet (200 mg total) by mouth 3 (three) times daily.   6 tablet   0   . SULFAMETHOXAZOLE-TRIMETHOPRIM 800-160 MG PO TABS   Oral   Take 1 tablet by mouth every 12 (twelve) hours.   14 tablet   0     BP 143/71  Pulse 100  Temp 97.7 F (36.5 C) (Oral)  Resp 20  Ht 5\' 5"  (1.651 m)  Wt 175 lb (79.379 kg)  BMI 29.12 kg/m2  SpO2 100%  Physical Exam  Nursing note and vitals reviewed. Constitutional: She appears well-developed and well-nourished.  HENT:  Head: Normocephalic and atraumatic.  Eyes: Conjunctivae normal are normal.  Neck: Normal range of motion.  Cardiovascular: Normal rate, regular rhythm, normal heart sounds and intact distal pulses.   Pulmonary/Chest: Effort normal and breath sounds normal. She has no wheezes.  Abdominal: Soft. Bowel sounds are normal. There is no tenderness. There is no rebound and no CVA tenderness.  Musculoskeletal: Normal range of motion.  Neurological: She is alert.  Skin: Skin is warm and dry.  Psychiatric: She has a normal mood and affect.    ED Course  Procedures (including critical care time)  Labs Reviewed  URINALYSIS, ROUTINE W REFLEX MICROSCOPIC - Abnormal; Notable for the following:    APPearance CLOUDY (*)      Specific Gravity, Urine >1.030 (*)     Hgb urine dipstick TRACE (*)     Bilirubin Urine SMALL (*)     Ketones, ur TRACE (*)     Nitrite POSITIVE (*)     All other components within normal limits  URINE MICROSCOPIC-ADD ON - Abnormal; Notable for the following:    Bacteria, UA MANY (*)     All other components within normal limits  URINE CULTURE   No results found.   1. UTI (lower urinary tract infection)       MDM  Uti, culture sent.  Pt prescribed bactrim, pyridium.  Also prescribed diflucan.  Pt with no vaginal sx,  Not sexually active,  Low risk for std.          Burgess Amor, [place] 10/30/12 [phone]

## 2012-12-08 ENCOUNTER — Emergency Department (HOSPITAL_COMMUNITY)
Admission: EM | Admit: 2012-12-08 | Discharge: 2012-12-08 | Disposition: A | Discharge status: Home / Self Care | Payer: Medicaid - Out of State | Attending: Emergency Medicine | Admitting: Emergency Medicine

## 2012-12-08 ENCOUNTER — Encounter (HOSPITAL_COMMUNITY): Payer: Self-pay | Admitting: Emergency Medicine

## 2012-12-08 DIAGNOSIS — J3489 Other specified disorders of nose and nasal sinuses: Secondary | ICD-10-CM | POA: Insufficient documentation

## 2012-12-08 DIAGNOSIS — Z8669 Personal history of other diseases of the nervous system and sense organs: Secondary | ICD-10-CM | POA: Insufficient documentation

## 2012-12-08 DIAGNOSIS — R350 Frequency of micturition: Secondary | ICD-10-CM | POA: Insufficient documentation

## 2012-12-08 DIAGNOSIS — N390 Urinary tract infection, site not specified: Secondary | ICD-10-CM | POA: Insufficient documentation

## 2012-12-08 DIAGNOSIS — R300 Dysuria: Secondary | ICD-10-CM | POA: Insufficient documentation

## 2012-12-08 DIAGNOSIS — Z79899 Other long term (current) drug therapy: Secondary | ICD-10-CM | POA: Insufficient documentation

## 2012-12-08 DIAGNOSIS — J329 Chronic sinusitis, unspecified: Secondary | ICD-10-CM | POA: Insufficient documentation

## 2012-12-08 DIAGNOSIS — R3915 Urgency of urination: Secondary | ICD-10-CM | POA: Insufficient documentation

## 2012-12-08 DIAGNOSIS — Z113 Encounter for screening for infections with a predominantly sexual mode of transmission: Secondary | ICD-10-CM | POA: Insufficient documentation

## 2012-12-08 DIAGNOSIS — F17200 Nicotine dependence, unspecified, uncomplicated: Secondary | ICD-10-CM | POA: Insufficient documentation

## 2012-12-08 DIAGNOSIS — Z202 Contact with and (suspected) exposure to infections with a predominantly sexual mode of transmission: Secondary | ICD-10-CM

## 2012-12-08 LAB — URINALYSIS, ROUTINE W REFLEX MICROSCOPIC
Leukocytes, UA: NEGATIVE
Nitrite: POSITIVE — AB
Protein, ur: NEGATIVE mg/dL
Specific Gravity, Urine: >1.03 — ABNORMAL HIGH (ref 1.005–1.030)
Urobilinogen, UA: 0.2 mg/dL (ref 0.0–1.0)

## 2012-12-08 LAB — WET PREP, GENITAL
Trich, Wet Prep: NONE SEEN
Yeast Wet Prep HPF POC: NONE SEEN

## 2012-12-08 LAB — URINE MICROSCOPIC-ADD ON

## 2012-12-08 MED ORDER — AZITHROMYCIN 250 MG PO TABS
1000.0000 mg | ORAL_TABLET | Freq: Once | ORAL | Status: DC
  Filled 2012-12-08: qty 4

## 2012-12-08 MED ORDER — CEPHALEXIN 500 MG PO CAPS: 500.0000 mg | ORAL_CAPSULE | Freq: Four times a day (QID) | ORAL | Status: DC

## 2012-12-08 NOTE — ED Provider Notes (Signed)
History     CSN: [id number]  Arrival date & time 12/08/12  1505   None     Chief Complaint  Patient presents with  . Facial Pain  . Nasal Congestion  . Urinary Tract Infection    (Consider location/radiation/quality/duration/timing/severity/associated sxs/prior treatment) HPI Kristy Christian is a 52 y.o. female who presents to the ED with facial pain. The pain started 3 days ago. She describes the pain as pressure. The pain is located around the eyes. She rates the pain as 7/10. Associated symptoms include congestion, yellow/green mucous and headache. Patient also request repeat urine. She also request STI testing. Her partner is a Naval architect and told her he may have an infection from sex with someone else. Patient had a partial hysterectomy approximately 5 years ago in [place]. They took the uterus but left the ovaries and cervix. The history was provided by the patient.  Past Medical History  Diagnosis Date  . Neurofibromatosis     Past Surgical History  Procedure Date  . Abdominal hysterectomy   . Breast surgery   . Breast lumpectomy   . Cesarean section   . Cholecystectomy     No family history on file.  History  Substance Use Topics  . Smoking status: Current Every Day Smoker    Types: Cigarettes  . Smokeless tobacco: Not on file  . Alcohol Use: No    OB History    Grav Para Term Preterm Abortions TAB SAB Ect Mult Living                  Review of Systems  Constitutional: Negative for fever, chills, diaphoresis and fatigue.  HENT: Positive for congestion, sore throat and sinus pressure. Negative for ear pain, facial swelling, neck pain, neck stiffness and dental problem.   Eyes: Negative for photophobia, pain and discharge.  Respiratory: Positive for cough. Negative for chest tightness and wheezing.   Cardiovascular: Negative for chest pain.  Gastrointestinal: Positive for diarrhea. Negative for nausea, vomiting, abdominal pain, constipation and abdominal  distention.  Genitourinary: Positive for dysuria, urgency and frequency. Negative for flank pain, vaginal bleeding, vaginal discharge, difficulty urinating and pelvic pain.  Musculoskeletal: Negative for myalgias, back pain and gait problem.  Skin: Negative for color change and rash.  Neurological: Positive for headaches. Negative for dizziness, speech difficulty, weakness, light-headedness and numbness.  Psychiatric/Behavioral: Negative for confusion and agitation. The patient is not nervous/anxious.     Allergies  Review of patient's allergies indicates no known allergies.  Home Medications   Current Outpatient Rx  Name  Route  Sig  Dispense  Refill  . VITAMIN B 12 PO   Oral   Take 1 tablet by mouth daily.         . LUBIPROSTONE 24 MCG PO CAPS   Oral   Take 24 mcg by mouth 2 (two) times daily.         . ADULT MULTIVITAMIN W/MINERALS CH   Oral   Take 1 tablet by mouth daily.         Marland Kitchen OMEPRAZOLE MAGNESIUM 20 MG PO TBEC   Oral   Take 20 mg by mouth daily.         Marland Kitchen PHENAZOPYRIDINE HCL 200 MG PO TABS   Oral   Take 1 tablet (200 mg total) by mouth 3 (three) times daily.   6 tablet   0   . SULFAMETHOXAZOLE-TRIMETHOPRIM 800-160 MG PO TABS   Oral   Take 1 tablet by mouth every  12 (twelve) hours.   14 tablet   0     BP 131/71  Pulse 77  Temp 97.6 F (36.4 C) (Oral)  Ht 5\' 5"  (1.651 m)  Wt 180 lb (81.647 kg)  BMI 29.95 kg/m2  SpO2 100%  Physical Exam  Nursing note and vitals reviewed. Constitutional: She is oriented to person, place, and time. She appears well-developed and well-nourished.  HENT:  Head: Normocephalic and atraumatic.  Right Ear: Tympanic membrane, external ear and ear canal normal.  Left Ear: Tympanic membrane, external ear and ear canal normal.  Nose: Right sinus exhibits maxillary sinus tenderness and frontal sinus tenderness. Left sinus exhibits maxillary sinus tenderness and frontal sinus tenderness.  Eyes: Conjunctivae normal and EOM  are normal. Pupils are equal, round, and reactive to light.  Neck: Neck supple.  Cardiovascular: Normal rate, regular rhythm and normal heart sounds.   Pulmonary/Chest: Effort normal. No respiratory distress. She has no wheezes. She has no rales.  Abdominal: Soft. There is no tenderness.  Genitourinary:       External genitalia without lesions. White discharge vaginal vault. No tenderness on bimanual exam.   Musculoskeletal: Normal range of motion. She exhibits no edema.  Neurological: She is alert and oriented to person, place, and time. No cranial nerve deficit.  Skin: Skin is warm and dry.  Psychiatric: She has a normal mood and affect. Her behavior is normal. Judgment and thought content normal.   Results for orders placed during the hospital encounter of 12/08/12 (from the past 24 hour(s))  URINALYSIS, ROUTINE W REFLEX MICROSCOPIC     Status: Abnormal   Collection Time   12/08/12  3:44 PM      Component Value Range   Color, Urine YELLOW  YELLOW   APPearance CLEAR  CLEAR   Specific Gravity, Urine >1.030 (*) 1.005 - 1.030   pH 5.5  5.0 - 8.0   Glucose, UA NEGATIVE  NEGATIVE mg/dL   Hgb urine dipstick TRACE (*) NEGATIVE   Bilirubin Urine NEGATIVE  NEGATIVE   Ketones, ur NEGATIVE  NEGATIVE mg/dL   Protein, ur NEGATIVE  NEGATIVE mg/dL   Urobilinogen, UA 0.2  0.0 - 1.0 mg/dL   Nitrite POSITIVE (*) NEGATIVE   Leukocytes, UA NEGATIVE  NEGATIVE  URINE MICROSCOPIC-ADD ON     Status: Abnormal   Collection Time   12/08/12  3:44 PM      Component Value Range   Squamous Epithelial / LPF FEW (*) RARE   WBC, UA 0-2  <3 WBC/hpf   RBC / HPF 0-2  <3 RBC/hpf   Bacteria, UA MANY (*) RARE    Assessment: 52 y.o. female with facial pain   Sinusitis   Possible STI exposure   UTI  Plan:  Zithromax 1 gram PO   Rx Keflex   Cultures pending   Follow up with [facility] Department   Discussed with the patient and all questioned fully answered. She will return if any problems arise.   Medication  List     As of 12/08/2012  4:39 PM    START taking these medications         cephALEXin 500 MG capsule   Commonly known as: KEFLEX   Take 1 capsule (500 mg total) by mouth 4 (four) times daily.      ASK your doctor about these medications         lubiprostone 24 MCG capsule   Commonly known as: AMITIZA      multivitamin with minerals Tabs  omeprazole 20 MG tablet   Commonly known as: PRILOSEC OTC      phenazopyridine 200 MG tablet   Commonly known as: PYRIDIUM   Take 1 tablet (200 mg total) by mouth 3 (three) times daily.      sulfamethoxazole-trimethoprim 800-160 MG per tablet   Commonly known as: BACTRIM DS,SEPTRA DS   Take 1 tablet by mouth every 12 (twelve) hours.      VITAMIN B 12 PO          Where to get your medications    These are the prescriptions that you need to pick up.   You may get these medications from any pharmacy.         cephALEXin 500 MG capsule            Procedures [facility], NP 12/08/12 [address] [place], NP 12/08/12 [phone]

## 2012-12-08 NOTE — ED Notes (Signed)
Pt c/o sinus pain and congestion x 2 days. Pt also c/o burning with urination/urine discoloration/malodorous.

## 2012-12-08 NOTE — ED Notes (Addendum)
Pt refused to take abx to be administered here. Pt said she would not take meds on an empty stomach. Offered the pt crackers and something to drink. Pt still refused to take meds. ED NP notified.

## 2012-12-09 NOTE — ED Provider Notes (Signed)
Medical screening examination/treatment/procedure(s) were performed by non-physician practitioner and as supervising physician I was immediately available for consultation/collaboration.   Danaisha Celli, MD 12/09/12 0122 

## 2013-02-09 LAB — GC/CHLAMYDIA PROBE AMP, GENITAL
Chlamydia, DNA Probe: NEGATIVE
GC Probe Amp, Genital: NEGATIVE

## 2013-02-14 ENCOUNTER — Emergency Department (HOSPITAL_COMMUNITY)
Admission: EM | Admit: 2013-02-14 | Discharge: 2013-02-14 | Disposition: A | Discharge status: Home / Self Care | Payer: Self-pay | Attending: Emergency Medicine | Admitting: Emergency Medicine

## 2013-02-14 ENCOUNTER — Encounter (HOSPITAL_COMMUNITY): Payer: Self-pay | Admitting: Emergency Medicine

## 2013-02-14 DIAGNOSIS — Z711 Person with feared health complaint in whom no diagnosis is made: Secondary | ICD-10-CM | POA: Insufficient documentation

## 2013-02-14 DIAGNOSIS — Q8500 Neurofibromatosis, unspecified: Secondary | ICD-10-CM | POA: Insufficient documentation

## 2013-02-14 DIAGNOSIS — Z0000 Encounter for general adult medical examination without abnormal findings: Secondary | ICD-10-CM

## 2013-02-14 DIAGNOSIS — F17200 Nicotine dependence, unspecified, uncomplicated: Secondary | ICD-10-CM | POA: Insufficient documentation

## 2013-02-14 DIAGNOSIS — Z79899 Other long term (current) drug therapy: Secondary | ICD-10-CM | POA: Insufficient documentation

## 2013-02-14 LAB — URINALYSIS, ROUTINE W REFLEX MICROSCOPIC
Bilirubin Urine: NEGATIVE
Nitrite: NEGATIVE
Protein, ur: NEGATIVE mg/dL
Specific Gravity, Urine: 1.025 (ref 1.005–1.030)
Urobilinogen, UA: 0.2 mg/dL (ref 0.0–1.0)

## 2013-02-14 LAB — URINE MICROSCOPIC-ADD ON

## 2013-02-14 NOTE — ED Notes (Signed)
Pt reports is here for UTI recheck, due to job requirements. Pt denies UTI s/s to include pain and fever at this time. NAD noted

## 2013-02-14 NOTE — ED Notes (Signed)
MD at bedside. EDP at [facility]

## 2013-02-14 NOTE — ED Notes (Signed)
Pt states had drug screen for job and was told she had a UTI. Pt states unable to work until UTI is cleared up.denies symptoms

## 2013-02-14 NOTE — ED Provider Notes (Signed)
History     CSN: [id number]  Arrival date & time 02/14/13  1027   First MD Initiated Contact with Patient 02/14/13 1109      Chief Complaint  Patient presents with  . Urinary Tract Infection    (Consider location/radiation/quality/duration/timing/severity/associated sxs/prior treatment) HPI Comments: Kristy Christian is a 52 y.o. Female who presents for assistance with recently diagnosed uti.  She was given a drug screen as a pre-employment physical exam and was found to have a uti.  She was placed on a 3 day course of bactrim which she finished 3 days ago.  She reports she never had symptoms of a uti including no dysuria, hematuria,  Nausea, fever, low back or abdominal pain.  She requires a work note stating her infection is gone before she can start working.  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  She will be working at a [employer].      The history is provided by the patient.    Past Medical History  Diagnosis Date  . Neurofibromatosis     Past Surgical History  Procedure Laterality Date  . Abdominal hysterectomy    . Breast surgery    . Breast lumpectomy    . Cesarean section    . Cholecystectomy      History reviewed. No pertinent family history.  History  Substance Use Topics  . Smoking status: Current Every Day Smoker    Types: Cigarettes  . Smokeless tobacco: Not on file  . Alcohol Use: No    OB History   Grav Para Term Preterm Abortions TAB SAB Ect Mult Living                  Review of Systems  Constitutional: Negative for fever and chills.  HENT: Negative for congestion, sore throat and neck pain.   Eyes: Negative.   Respiratory: Negative for chest tightness and shortness of breath.   Cardiovascular: Negative for chest pain.  Gastrointestinal: Negative for nausea and abdominal pain.   Genitourinary: Negative.  Negative for dysuria, urgency, frequency, hematuria, flank pain and pelvic pain.  Musculoskeletal: Negative for joint swelling and arthralgias.  Skin: Negative.  Negative for rash and wound.  Neurological: Negative for dizziness, weakness, light-headedness, numbness and headaches.  Psychiatric/Behavioral: Negative.     Allergies  Review of patient's allergies indicates no known allergies.  Home Medications   Current Outpatient Rx  Name  Route  Sig  Dispense  Refill  . Cyanocobalamin (VITAMIN B 12 PO)   Oral   Take 1 tablet by mouth daily.         Marland Kitchen lubiprostone (AMITIZA) 24 MCG capsule   Oral   Take 24 mcg by mouth 2 (two) times daily.         . Multiple Vitamin (MULTIVITAMIN WITH MINERALS) TABS   Oral   Take 1 tablet by mouth daily.         Marland Kitchen omeprazole (PRILOSEC OTC) 20 MG tablet   Oral   Take 20 mg by mouth daily.           BP 133/71  Pulse 89  Temp(Src) 97.6 F (36.4 C) (Oral)  Resp 18  Ht 5\' 4"  (1.626 m)  Wt 180 lb (81.647 kg)  BMI 30.88 kg/m2  SpO2 100%  Physical Exam  Nursing note and vitals reviewed. Constitutional: She is oriented  to person, place, and time. She appears well-developed and well-nourished.  HENT:  Head: Normocephalic and atraumatic.  Eyes: Conjunctivae are normal.  Neck: Normal range of motion.  Cardiovascular: Normal rate.   Pulmonary/Chest: Effort normal.  Abdominal: Soft. Bowel sounds are normal. There is no tenderness.  Musculoskeletal: Normal range of motion.  Neurological: She is alert and oriented to person, place, and time.  Skin: Skin is warm and dry.  Nodules c/w neurofibromatosis.  Psychiatric: She has a normal mood and affect.    ED Course  Procedures (including critical care time)  Labs Reviewed  URINALYSIS, ROUTINE W REFLEX MICROSCOPIC - Abnormal; Notable for the following:    Hgb urine dipstick TRACE (*)    All other components within normal limits  URINE MICROSCOPIC-ADD ON    No results found.   1. Normal physical exam       MDM  Patients labs and/or radiological studies were viewed and considered during the medical decision making and disposition process.  Normal exam with no uti on todays UA.  Work note given clearing her for work.  PRN f/u with pcp recommended.        Burgess Amor, PA-C 02/14/13 2246

## 2013-02-15 NOTE — ED Provider Notes (Signed)
Medical screening examination/treatment/procedure(s) were performed by non-physician practitioner and as supervising physician I was immediately available for consultation/collaboration. See prior note   Ward Givens, MD 02/15/13 1444

## 2013-06-13 ENCOUNTER — Emergency Department (HOSPITAL_COMMUNITY): Payer: Self-pay

## 2013-06-13 ENCOUNTER — Encounter (HOSPITAL_COMMUNITY): Payer: Self-pay | Admitting: *Deleted

## 2013-06-13 ENCOUNTER — Emergency Department (HOSPITAL_COMMUNITY)
Admission: EM | Admit: 2013-06-13 | Discharge: 2013-06-13 | Discharge status: Against Medical Advice | Payer: Self-pay | Attending: Emergency Medicine | Admitting: Emergency Medicine

## 2013-06-13 DIAGNOSIS — Z8742 Personal history of other diseases of the female genital tract: Secondary | ICD-10-CM | POA: Insufficient documentation

## 2013-06-13 DIAGNOSIS — Z87442 Personal history of urinary calculi: Secondary | ICD-10-CM | POA: Insufficient documentation

## 2013-06-13 DIAGNOSIS — K56609 Unspecified intestinal obstruction, unspecified as to partial versus complete obstruction: Secondary | ICD-10-CM | POA: Insufficient documentation

## 2013-06-13 DIAGNOSIS — M545 Low back pain, unspecified: Secondary | ICD-10-CM | POA: Insufficient documentation

## 2013-06-13 DIAGNOSIS — R110 Nausea: Secondary | ICD-10-CM | POA: Insufficient documentation

## 2013-06-13 DIAGNOSIS — K56600 Partial intestinal obstruction, unspecified as to cause: Secondary | ICD-10-CM

## 2013-06-13 DIAGNOSIS — R198 Other specified symptoms and signs involving the digestive system and abdomen: Secondary | ICD-10-CM | POA: Insufficient documentation

## 2013-06-13 DIAGNOSIS — F17200 Nicotine dependence, unspecified, uncomplicated: Secondary | ICD-10-CM | POA: Insufficient documentation

## 2013-06-13 DIAGNOSIS — Z79899 Other long term (current) drug therapy: Secondary | ICD-10-CM | POA: Insufficient documentation

## 2013-06-13 DIAGNOSIS — IMO0002 Reserved for concepts with insufficient information to code with codable children: Secondary | ICD-10-CM | POA: Insufficient documentation

## 2013-06-13 DIAGNOSIS — Z87440 Personal history of urinary (tract) infections: Secondary | ICD-10-CM | POA: Insufficient documentation

## 2013-06-13 HISTORY — DX: Urinary tract infection, site not specified: N39.0

## 2013-06-13 LAB — URINALYSIS, ROUTINE W REFLEX MICROSCOPIC
Bilirubin Urine: NEGATIVE
Ketones, ur: NEGATIVE mg/dL
Specific Gravity, Urine: >1.03 — ABNORMAL HIGH (ref 1.005–1.030)
pH: 5.5 (ref 5.0–8.0)

## 2013-06-13 LAB — URINE MICROSCOPIC-ADD ON

## 2013-06-13 MED ORDER — ONDANSETRON HCL 4 MG PO TABS: 4.0000 mg | ORAL_TABLET | Freq: Three times a day (TID) | ORAL | Status: DC | PRN

## 2013-06-13 NOTE — ED Provider Notes (Signed)
CSN: [id number]     Arrival date & time 06/13/13  2004 History     First MD Initiated Contact with Patient 06/13/13 2018     Chief Complaint  Patient presents with  . Flank Pain   HPI Pt was seen at 2025. Per pt, c/o gradual onset and persistence of waxing and waning right sided low back "pain" for the past 1 week. Pain worsens with palpation of the area and body position changes.  Has been associated with nausea. Pt endorses hx of UTI and kidney stones and is concerned regarding both as cause for her symptoms today. Denies vaginal bleeding/discharge, no dysuria/hematuria, no abd pain, no vomiting/diarrhea, no black or blood in stools, no CP/SOB.    Past Medical History  Diagnosis Date  . Neurofibromatosis   . UTI (lower urinary tract infection)   . Kidney stones    Past Surgical History  Procedure Laterality Date  . Abdominal hysterectomy    . Breast surgery    . Breast lumpectomy    . Cesarean section    . Cholecystectomy      History  Substance Use Topics  . Smoking status: Current Every Day Smoker    Types: Cigarettes  . Smokeless tobacco: Not on file  . Alcohol Use: No    Review of Systems ROS: Statement: All systems negative except as marked or noted in the HPI; Constitutional: Negative for fever and chills. ; ; Eyes: Negative for eye pain, redness and discharge. ; ; ENMT: Negative for ear pain, hoarseness, nasal congestion, sinus pressure and sore throat. ; ; Cardiovascular: Negative for chest pain, palpitations, diaphoresis, dyspnea and peripheral edema. ; ; Respiratory: Negative for cough, wheezing and stridor. ; ; Gastrointestinal: +nausea. Negative for vomiting, diarrhea, abdominal pain, blood in stool, hematemesis, jaundice and rectal bleeding. . ; ; Genitourinary: Negative for dysuria, flank pain and hematuria. ; GYN:  No vaginal bleeding, no vaginal discharge, no vulvar pain.;;; Musculoskeletal: +LBP. Negative for neck pain. Negative for swelling and trauma.; ;  Skin: Negative for pruritus, rash, abrasions, blisters, bruising and skin lesion.; ; Neuro: Negative for headache, lightheadedness and neck stiffness. Negative for weakness, altered level of consciousness , altered mental status, extremity weakness, paresthesias, involuntary movement, seizure and syncope.       Allergies  Review of patient's allergies indicates no known allergies.  Home Medications   Current Outpatient Rx  Name  Route  Sig  Dispense  Refill  . Cyanocobalamin (VITAMIN B 12 PO)   Oral   Take 1 tablet by mouth daily.         Marland Kitchen lubiprostone (AMITIZA) 24 MCG capsule   Oral   Take 24 mcg by mouth 2 (two) times daily.         . Multiple Vitamin (MULTIVITAMIN WITH MINERALS) TABS   Oral   Take 1 tablet by mouth daily.         Marland Kitchen omeprazole (PRILOSEC OTC) 20 MG tablet   Oral   Take 20 mg by mouth daily.          BP 101/41  Pulse 93  Temp(Src) 98.6 F (37 C) (Oral)  Ht 5\' 5"  (1.651 m)  Wt 180 lb (81.647 kg)  BMI 29.95 kg/m2  SpO2 99% Physical Exam 2030: Physical examination:  Nursing notes reviewed; Vital signs and O2 SAT reviewed;  Constitutional: Well developed, Well nourished, Well hydrated, In no acute distress; Head:  Normocephalic, atraumatic; Eyes: EOMI, PERRL, No scleral icterus; ENMT: Mouth and pharynx normal, Mucous  membranes moist; Neck: Supple, Full range of motion, No lymphadenopathy; Cardiovascular: Regular rate and rhythm, No murmur, rub, or gallop; Respiratory: Breath sounds clear & equal bilaterally, No rales, rhonchi, wheezes.  Speaking full sentences with ease, Normal respiratory effort/excursion; Chest: Nontender, Movement normal; Abdomen: Soft, Nontender, Nondistended, Normal bowel sounds; Genitourinary: No CVA tenderness; Spine:  No midline CS, TS, LS tenderness. +TTP right lumbar paraspinal muscles;; Extremities: Pulses normal, No tenderness, No edema, No calf edema or asymmetry.; Neuro: AA&Ox3, Major CN grossly intact.  Speech clear. No  gross focal motor or sensory deficits in extremities.; Skin: Color normal, Warm, Dry, +neurofibromatosis.   ED Course     Procedures    MDM  MDM Reviewed: previous chart, nursing note and vitals Interpretation: labs and CT scan   Results for orders placed during the hospital encounter of 06/13/13  URINALYSIS, ROUTINE W REFLEX MICROSCOPIC      Result Value Range   Color, Urine YELLOW  YELLOW   APPearance HAZY (*) CLEAR   Specific Gravity, Urine >1.030 (*) 1.005 - 1.030   pH 5.5  5.0 - 8.0   Glucose, UA NEGATIVE  NEGATIVE mg/dL   Hgb urine dipstick TRACE (*) NEGATIVE   Bilirubin Urine NEGATIVE  NEGATIVE   Ketones, ur NEGATIVE  NEGATIVE mg/dL   Protein, ur NEGATIVE  NEGATIVE mg/dL   Urobilinogen, UA 0.2  0.0 - 1.0 mg/dL   Nitrite NEGATIVE  NEGATIVE   Leukocytes, UA SMALL (*) NEGATIVE  URINE MICROSCOPIC-ADD ON      Result Value Range   Squamous Epithelial / LPF MANY (*) RARE   WBC, UA 3-6  <3 WBC/hpf   RBC / HPF 0-2  <3 RBC/hpf   Bacteria, UA MANY (*) RARE   Ct Abdomen Pelvis Wo Contrast 06/13/2013   *RADIOLOGY REPORT*  Clinical Data: Flank pain  CT ABDOMEN AND PELVIS WITHOUT CONTRAST  Technique:  Multidetector CT imaging of the abdomen and pelvis was performed following the standard protocol without intravenous contrast.  Comparison: 09/22/2012  Findings: Lung bases are free of acute infiltrate or sizable effusion.  Multiple neurofibromas are noted covering the skin surface consistent with the patient's given clinical history.  The gallbladder has been surgically removed.  The liver and spleen are stable in appearance.  The adrenal glands again show a normal right adrenal gland with a nodule within the left adrenal gland which is stable from prior study.  The pancreas is unremarkable. The kidneys are well visualized bilaterally without evidence of renal calculi urinary tract obstructive changes.  The common bile duct is prominent but within normal limits given the previous  cholecystectomy.  This is also stable from prior exam.  Multiple dilated loops of small bowel are identified which extends to almost the distal terminal ileum.  There is an area of transition noted in the right lower quadrant similar to that seen on the prior exam.  The appendix is within normal limits.  The visualized colon is unremarkable.  The uterus has been surgically removed.  The bladder is incompletely distended.  IMPRESSION: Dilated loops of small bowel consistent with partial small bowel obstruction with a transition point in the distal ileum similar to that seen on the prior exam.  A specific mass is not visualized at this time.  The overall appearance is similar to that noted on the prior exam.  Cutaneous neural fibromatosis.  Stable left adrenal nodule.   Original Report Authenticated By: Alcide Clever, M.D.     2220:  No clear UTI on  Udip (appears contaminated); UC pending.  Pt informed re: dx testing results, including SBO on CT scan and that I recommend admission for further evaluation.  Pt refuses admission.  I encouraged pt to stay, continues to refuse.  Pt makes her own medical decisions.  Risks of AMA explained to pt, including, but not limited to:  Peritonitis, mesenteric ischemia, stroke, heart attack, cardiac arrythmia ("irregular heart rate/beat"), "passing out," temporary and/or permanent disability, death.  Pt verb understanding and continues to refuse admission, understanding the consequences of her decision.  I encouraged pt to follow up with her PMD tomorrow and return to the ED immediately if symptoms return, worsen, or for any other concerns.  Pt verb understanding, agreeable.   Laray Anger, DO 06/14/13 1659

## 2013-06-13 NOTE — ED Notes (Signed)
C/o right flank pain/low back pain onset 3 days ago. C/o nausea. States she thinks she has a UTI.

## 2013-06-13 NOTE — ED Notes (Signed)
Pt refusing admission for SBO. Will not have insurance until end of aug. Has had previous SBO and is aware of need for clear liquid diet and states she will return for any increase in pain, n/v/etc.

## 2013-06-15 ENCOUNTER — Encounter (HOSPITAL_COMMUNITY): Payer: Self-pay

## 2013-06-15 LAB — URINE CULTURE

## 2013-06-16 NOTE — Progress Notes (Addendum)
ED Antimicrobial Stewardship Positive Culture Follow Up   Kristy Christian is an 52 y.o. female who presented to [facility] on 06/13/2013 with a chief complaint of  Chief Complaint  Patient presents with  . Flank Pain    Recent Results (from the past 720 hour(s))  URINE CULTURE     Status: None   Collection Time    06/13/13  8:20 PM      Result Value Range Status   Specimen Description URINE, CLEAN CATCH   Final   Special Requests NONE   Final   Culture  Setup Time 06/13/2013 21:00   Final   Colony Count 80,000 COLONIES/ML   Final   Culture ESCHERICHIA COLI   Final   Report Status 06/15/2013 FINAL   Final   Organism ID, Bacteria ESCHERICHIA COLI   Final    [x]  Patient discharged originally without antimicrobial agent and treatment is now indicated  New antibiotic prescription: Keflex 500mg  PO TID x 7 days  ED Provider: Magnus Sinning, PA-C   Sallee Provencal 06/16/2013, 11:05 AM Infectious Diseases Pharmacist Phone# [phone]

## 2013-06-16 NOTE — ED Notes (Signed)
Post ED Visit - Positive Culture Follow-up: Successful Patient Follow-Up  Culture assessed and recommendations reviewed by: []  Wes Dulaney, Pharm.D., BCPS []  Celedonio Miyamoto, Pharm.D., BCPS []  Georgina Pillion, Pharm.D., BCPS []  [place], [address].D., BCPS, AAHIVP [x]  Estella Husk, Pharm.D., BCPS, AAHIVP  Positive urine culture  []  Patient discharged without antimicrobial prescription and treatment is now indicated [x]  Organism is resistant to prescribed ED discharge antimicrobial []  Patient with positive blood cultures  Changes discussed with ED provider: Pascal Lux Wingen New antibiotic prescription Keflex 500 mg PO TID x 7 days ,21 capsules Called to-[facility] [place] [phone] Contacted patient-Patient informed of positive results, date 05/17/2013, time 1310  Larena Sox 06/16/2013, 12:30 PM

## 2013-08-14 DIAGNOSIS — F4323 Adjustment disorder with mixed anxiety and depressed mood: Secondary | ICD-10-CM | POA: Insufficient documentation

## 2014-02-11 ENCOUNTER — Encounter (HOSPITAL_COMMUNITY): Payer: Self-pay | Admitting: Emergency Medicine

## 2014-02-11 ENCOUNTER — Emergency Department (HOSPITAL_COMMUNITY)
Admission: EM | Admit: 2014-02-11 | Discharge: 2014-02-11 | Disposition: A | Discharge status: Home / Self Care | Payer: 59 | Attending: Emergency Medicine | Admitting: Emergency Medicine

## 2014-02-11 DIAGNOSIS — R109 Unspecified abdominal pain: Secondary | ICD-10-CM | POA: Insufficient documentation

## 2014-02-11 DIAGNOSIS — Z87828 Personal history of other (healed) physical injury and trauma: Secondary | ICD-10-CM | POA: Insufficient documentation

## 2014-02-11 DIAGNOSIS — H579 Unspecified disorder of eye and adnexa: Secondary | ICD-10-CM | POA: Insufficient documentation

## 2014-02-11 DIAGNOSIS — Z87442 Personal history of urinary calculi: Secondary | ICD-10-CM | POA: Insufficient documentation

## 2014-02-11 DIAGNOSIS — F17200 Nicotine dependence, unspecified, uncomplicated: Secondary | ICD-10-CM | POA: Insufficient documentation

## 2014-02-11 DIAGNOSIS — M25559 Pain in unspecified hip: Secondary | ICD-10-CM | POA: Insufficient documentation

## 2014-02-11 DIAGNOSIS — R350 Frequency of micturition: Secondary | ICD-10-CM | POA: Insufficient documentation

## 2014-02-11 DIAGNOSIS — M25569 Pain in unspecified knee: Secondary | ICD-10-CM | POA: Insufficient documentation

## 2014-02-11 DIAGNOSIS — G8929 Other chronic pain: Secondary | ICD-10-CM | POA: Insufficient documentation

## 2014-02-11 DIAGNOSIS — Z79899 Other long term (current) drug therapy: Secondary | ICD-10-CM | POA: Insufficient documentation

## 2014-02-11 DIAGNOSIS — Q8500 Neurofibromatosis, unspecified: Secondary | ICD-10-CM | POA: Insufficient documentation

## 2014-02-11 DIAGNOSIS — Z90710 Acquired absence of both cervix and uterus: Secondary | ICD-10-CM | POA: Insufficient documentation

## 2014-02-11 DIAGNOSIS — R51 Headache: Secondary | ICD-10-CM | POA: Insufficient documentation

## 2014-02-11 DIAGNOSIS — R599 Enlarged lymph nodes, unspecified: Secondary | ICD-10-CM | POA: Insufficient documentation

## 2014-02-11 DIAGNOSIS — R300 Dysuria: Secondary | ICD-10-CM | POA: Insufficient documentation

## 2014-02-11 DIAGNOSIS — M25519 Pain in unspecified shoulder: Secondary | ICD-10-CM | POA: Insufficient documentation

## 2014-02-11 DIAGNOSIS — Z87440 Personal history of urinary (tract) infections: Secondary | ICD-10-CM | POA: Insufficient documentation

## 2014-02-11 DIAGNOSIS — J3489 Other specified disorders of nose and nasal sinuses: Secondary | ICD-10-CM

## 2014-02-11 LAB — URINALYSIS, ROUTINE W REFLEX MICROSCOPIC
Bilirubin Urine: NEGATIVE
Glucose, UA: NEGATIVE mg/dL
Hgb urine dipstick: NEGATIVE
KETONES UR: NEGATIVE mg/dL
LEUKOCYTES UA: NEGATIVE
NITRITE: NEGATIVE
PROTEIN: NEGATIVE mg/dL
Specific Gravity, Urine: 1.02 (ref 1.005–1.030)
UROBILINOGEN UA: 4 mg/dL — AB (ref 0.0–1.0)
pH: 7 (ref 5.0–8.0)

## 2014-02-11 MED ORDER — SULFAMETHOXAZOLE-TRIMETHOPRIM 800-160 MG PO TABS: 1.0000 | ORAL_TABLET | Freq: Two times a day (BID) | ORAL | Status: DC

## 2014-02-11 MED ORDER — HYDROCODONE-ACETAMINOPHEN 5-325 MG PO TABS: 1.0000 | ORAL_TABLET | ORAL | Status: DC | PRN

## 2014-02-11 NOTE — Discharge Instructions (Signed)
Dysuria Dysuria is the medical term for pain with urination. There are many causes for dysuria, but urinary tract infection is the most common. If a urinalysis was performed it can show that there is a urinary tract infection. A urine culture confirms that you or your child is sick. You will need to follow up with a healthcare provider because:  If a urine culture was done you will need to know the culture results and treatment recommendations.  If the urine culture was positive, you or your child will need to be put on antibiotics or know if the antibiotics prescribed are the right antibiotics for your urinary tract infection.  If the urine culture is negative (no urinary tract infection), then other causes may need to be explored or antibiotics need to be stopped. Today laboratory work may have been done and there does not seem to be an infection. If cultures were done they will take at least 24 to 48 hours to be completed. Today x-rays may have been taken and they read as normal. No cause can be found for the problems. The x-rays may be re-read by a radiologist and you will be contacted if additional findings are made. You or your child may have been put on medications to help with this problem until you can see your primary caregiver. If the problems get better, see your primary caregiver if the problems return. If you were given antibiotics (medications which kill germs), take all of the mediations as directed for the full course of treatment.  If laboratory work was done, you need to find the results. Leave a telephone number where you can be reached. If this is not possible, make sure you find out how you are to get test results. HOME CARE INSTRUCTIONS   Drink lots of fluids. For adults, drink eight, 8 ounce glasses of clear juice or water a day. For children, replace fluids as suggested by your caregiver.  Empty the bladder often. Avoid holding urine for long periods of time.  After a bowel  movement, women should cleanse front to back, using each tissue only once.  Empty your bladder before and after sexual intercourse.  Take all the medicine given to you until it is gone. You may feel better in a few days, but TAKE ALL MEDICINE.  Avoid caffeine, tea, alcohol and carbonated beverages, because they tend to irritate the bladder.  In men, alcohol may irritate the prostate.  Only take over-the-counter or prescription medicines for pain, discomfort, or fever as directed by your caregiver.  If your caregiver has given you a follow-up appointment, it is very important to keep that appointment. Not keeping the appointment could result in a chronic or permanent injury, pain, and disability. If there is any problem keeping the appointment, you must call back to this facility for assistance. SEEK IMMEDIATE MEDICAL CARE IF:   Back pain develops.  A fever develops.  There is nausea (feeling sick to your stomach) or vomiting (throwing up).  Problems are no better with medications or are getting worse. MAKE SURE YOU:   Understand these instructions.  Will watch your condition.  Will get help right away if you are not doing well or get worse. Document Released: 07/31/2004 Document Revised: 01/25/2012 Document Reviewed: 06/07/2008 [facility] Patient Information 2014 [place].  Chronic Pain Chronic pain can be defined as pain that is off and on and lasts for 3 6 months or longer. Many things cause chronic pain, which can make it difficult  to make a diagnosis. There are many treatment options available for chronic pain. However, finding a treatment that works well for you may require trying various approaches until the right one is found. Many people benefit from a combination of two or more types of treatment to control their pain. SYMPTOMS  Chronic pain can occur anywhere in the body and can range from mild to very severe. Some types of chronic pain include:  Headache.  Low  back pain.  Cancer pain.  Arthritis pain.  Neurogenic pain. This is pain resulting from damage to nerves. People with chronic pain may also have other symptoms such as:  Depression.  Anger.  Insomnia.  Anxiety. DIAGNOSIS  Your health care provider will help diagnose your condition over time. In many cases, the initial focus will be on excluding possible conditions that could be causing the pain. Depending on your symptoms, your health care provider may order tests to diagnose your condition. Some of these tests may include:   Blood tests.   CT scan.   MRI.   X-rays.   Ultrasounds.   Nerve conduction studies.  You may need to see a specialist.  TREATMENT  Finding treatment that works well may take time. You may be referred to a pain specialist. He or she may prescribe medicine or therapies, such as:   Mindful meditation or yoga.  Shots (injections) of numbing or pain-relieving medicines into the spine or area of pain.  Local electrical stimulation.  Acupuncture.   Massage therapy.   Aroma, color, light, or sound therapy.   Biofeedback.   Working with a physical therapist to keep from getting stiff.   Regular, gentle exercise.   Cognitive or behavioral therapy.   Group support.  Sometimes, surgery may be recommended.  HOME CARE INSTRUCTIONS   Take all medicines as directed by your health care provider.   Lessen stress in your life by relaxing and doing things such as listening to calming music.   Exercise or be active as directed by your health care provider.   Eat a healthy diet and include things such as vegetables, fruits, fish, and lean meats in your diet.   Keep all follow-up appointments with your health care provider.   Attend a support group with others suffering from chronic pain. SEEK MEDICAL CARE IF:   Your pain gets worse.   You develop a new pain that was not there before.   You cannot tolerate medicines given  to you by your health care provider.   You have new symptoms since your last visit with your health care provider.  SEEK IMMEDIATE MEDICAL CARE IF:   You feel weak.   You have decreased sensation or numbness.   You lose control of bowel or bladder function.   Your pain suddenly gets much worse.   You develop shaking.  You develop chills.  You develop confusion.  You develop chest pain.  You develop shortness of breath.  MAKE SURE YOU:  Understand these instructions.  Will watch your condition.  Will get help right away if you are not doing well or get worse. Document Released: 07/25/2002 Document Revised: 07/05/2013 Document Reviewed: 04/28/2013 [facility] Patient Information 2014 [place].

## 2014-02-11 NOTE — ED Notes (Signed)
Chronic right shoulder pain, and chronic right leg pain.  Also reports UTI sx x 5 days.
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
## 2014-02-13 LAB — URINE CULTURE: Colony Count: 30000

## 2014-02-15 NOTE — ED Provider Notes (Signed)
Medical screening examination/treatment/procedure(s) were performed by non-physician practitioner and as supervising physician I was immediately available for consultation/collaboration.   EKG Interpretation None       Antwione Picotte, MD 02/15/14 1608 

## 2014-02-21 ENCOUNTER — Other Ambulatory Visit: Payer: Self-pay | Admitting: Women's Health

## 2014-03-06 ENCOUNTER — Telehealth: Payer: Self-pay

## 2014-03-06 NOTE — Telephone Encounter (Signed)
Pt was referred by Dr. Legrand Rams for screening colonoscopy. I called her and she is not having any problems but she does not wish to do it at this time. She will call me when she is ready.

## 2014-04-13 ENCOUNTER — Encounter: Payer: Self-pay | Admitting: *Deleted

## 2014-04-26 ENCOUNTER — Other Ambulatory Visit: Payer: Self-pay | Admitting: Advanced Practice Midwife

## 2014-07-09 ENCOUNTER — Other Ambulatory Visit (HOSPITAL_COMMUNITY): Payer: Self-pay | Admitting: Internal Medicine

## 2014-07-09 DIAGNOSIS — Z1231 Encounter for screening mammogram for malignant neoplasm of breast: Secondary | ICD-10-CM

## 2014-07-12 ENCOUNTER — Ambulatory Visit (HOSPITAL_COMMUNITY): Payer: 59

## 2014-07-19 ENCOUNTER — Encounter (INDEPENDENT_AMBULATORY_CARE_PROVIDER_SITE_OTHER): Payer: Self-pay | Admitting: *Deleted

## 2014-08-09 ENCOUNTER — Ambulatory Visit: Payer: 59 | Admitting: *Deleted

## 2014-09-17 ENCOUNTER — Encounter: Payer: Self-pay | Admitting: *Deleted

## 2014-12-05 ENCOUNTER — Other Ambulatory Visit (HOSPITAL_COMMUNITY): Payer: Self-pay | Admitting: Internal Medicine

## 2014-12-05 DIAGNOSIS — Q8500 Neurofibromatosis, unspecified: Secondary | ICD-10-CM

## 2014-12-11 ENCOUNTER — Other Ambulatory Visit (HOSPITAL_COMMUNITY): Payer: Self-pay | Admitting: Internal Medicine

## 2014-12-11 DIAGNOSIS — Z1231 Encounter for screening mammogram for malignant neoplasm of breast: Secondary | ICD-10-CM

## 2014-12-12 ENCOUNTER — Ambulatory Visit (HOSPITAL_COMMUNITY): Payer: 59

## 2014-12-19 ENCOUNTER — Ambulatory Visit (HOSPITAL_COMMUNITY): Payer: 59 | Attending: Internal Medicine

## 2015-01-03 ENCOUNTER — Ambulatory Visit (HOSPITAL_COMMUNITY): Payer: 59

## 2015-02-02 ENCOUNTER — Emergency Department (HOSPITAL_COMMUNITY)
Admission: EM | Admit: 2015-02-02 | Discharge: 2015-02-02 | Disposition: A | Discharge status: Home / Self Care | Payer: BLUE CROSS/BLUE SHIELD | Attending: Emergency Medicine | Admitting: Emergency Medicine

## 2015-02-02 ENCOUNTER — Encounter (HOSPITAL_COMMUNITY): Payer: Self-pay | Admitting: Emergency Medicine

## 2015-02-02 DIAGNOSIS — Z87442 Personal history of urinary calculi: Secondary | ICD-10-CM | POA: Insufficient documentation

## 2015-02-02 DIAGNOSIS — Z792 Long term (current) use of antibiotics: Secondary | ICD-10-CM | POA: Insufficient documentation

## 2015-02-02 DIAGNOSIS — Z8719 Personal history of other diseases of the digestive system: Secondary | ICD-10-CM | POA: Insufficient documentation

## 2015-02-02 DIAGNOSIS — Z720 Tobacco use: Secondary | ICD-10-CM | POA: Diagnosis not present

## 2015-02-02 DIAGNOSIS — Q8500 Neurofibromatosis, unspecified: Secondary | ICD-10-CM | POA: Insufficient documentation

## 2015-02-02 DIAGNOSIS — Z79899 Other long term (current) drug therapy: Secondary | ICD-10-CM | POA: Diagnosis not present

## 2015-02-02 DIAGNOSIS — R109 Unspecified abdominal pain: Secondary | ICD-10-CM | POA: Diagnosis present

## 2015-02-02 DIAGNOSIS — N390 Urinary tract infection, site not specified: Secondary | ICD-10-CM | POA: Diagnosis not present

## 2015-02-02 LAB — URINALYSIS, ROUTINE W REFLEX MICROSCOPIC
BILIRUBIN URINE: NEGATIVE
GLUCOSE, UA: NEGATIVE mg/dL
KETONES UR: NEGATIVE mg/dL
Nitrite: NEGATIVE
Protein, ur: NEGATIVE mg/dL
SPECIFIC GRAVITY, URINE: 1.025 (ref 1.005–1.030)
Urobilinogen, UA: 0.2 mg/dL (ref 0.0–1.0)
pH: 5.5 (ref 5.0–8.0)

## 2015-02-02 LAB — URINE MICROSCOPIC-ADD ON

## 2015-02-02 MED ORDER — SULFAMETHOXAZOLE-TRIMETHOPRIM 800-160 MG PO TABS: 1.0000 | ORAL_TABLET | Freq: Two times a day (BID) | ORAL | Status: AC

## 2015-02-02 MED ORDER — FLUCONAZOLE 100 MG PO TABS
200.0000 mg | ORAL_TABLET | Freq: Every day | ORAL | Status: DC
  Administered 2015-02-02: 200 mg via ORAL
  Filled 2015-02-02: qty 2

## 2015-02-02 NOTE — ED Provider Notes (Signed)
CSN: [id number]     Arrival date & time 02/02/15  1008 History   First MD Initiated Contact with Patient 02/02/15 1012     Chief Complaint  Patient presents with  . Abdominal Pain     (Consider location/radiation/quality/duration/timing/severity/associated sxs/prior Treatment) HPI Kristy Christian is a gastroenteritis female who presents to the ED with UTI symptoms. She states that for the past few days she has had suprapubic pressure and occasional nausea. She has had kidney stones in the past but states this is not painful like a stone it feels like when she has a UTI. She has not taken any OTC medication other than Aleve. She has had a few loose stools but states that is nothing new.    Past Medical History  Diagnosis Date  . Neurofibromatosis   . UTI (lower urinary tract infection)   . Kidney stones   . IBS (irritable bowel syndrome)   . Knee pain    Past Surgical History  Procedure Laterality Date  . Breast surgery    . Breast lumpectomy    . Cesarean section    . Cholecystectomy    . Abdominal hysterectomy      fibroid tumors   Family History  Problem Relation Age of Onset  . Arthritis Mother   . Cancer Mother     liver  . Arthritis Father   . Hypertension Father   . Stroke Father   . Other Other     arthralgia, multi sites  . Hypertension Other   . Cancer Other     breast; aunt   History  Substance Use Topics  . Smoking status: Current Some Day Smoker    Types: Cigarettes  . Smokeless tobacco: Not on file  . Alcohol Use: Yes     Comment: occ   OB History    Gravida Para Term Preterm AB TAB SAB Ectopic Multiple Living   4 4             Review of Systems Negative except as stated in HPI   Allergies  Review of patient's allergies indicates no known allergies.  Home Medications   Prior to Admission medications   Medication Sig Start Date End Date Taking? Authorizing Provider  Cyanocobalamin (VITAMIN B 12 PO) Take 1 tablet by mouth daily.   Yes  Historical Provider, MD  omeprazole (PRILOSEC) 20 MG capsule Take 20 mg by mouth 2 (two) times daily before a meal.   Yes Historical Provider, MD  lubiprostone (AMITIZA) 24 MCG capsule Take 24 mcg by mouth 2 (two) times daily.    Historical Provider, MD  sulfamethoxazole-trimethoprim (BACTRIM DS,SEPTRA DS) 800-160 MG per tablet Take 1 tablet by mouth 2 (two) times daily. 02/02/15 02/09/15  Hope Bunnie Pion, NP   BP 144/80 mmHg  Pulse 84  Temp(Src) 98 F (36.7 C) (Oral)  Resp 18  Ht 5\' 5"  (1.651 m)  Wt 170 lb (77.111 kg)  BMI 28.29 kg/m2  SpO2 100% Physical Exam  Constitutional: She is oriented to person, place, and time. She appears well-developed and well-nourished. No distress.  HENT:  Head: Normocephalic and atraumatic.  Right Ear: Tympanic membrane normal.  Left Ear: Tympanic membrane normal.  Nose: Nose normal.  Mouth/Throat: Uvula is midline, oropharynx is clear and moist and mucous membranes are normal.  Eyes: Conjunctivae and EOM are normal.  Neck: Normal range of motion. Neck supple.  Cardiovascular: Normal rate and regular rhythm.   Pulmonary/Chest: Effort normal. She has no wheezes. She has no  rales.  Abdominal: Soft. Bowel sounds are normal. There is tenderness in the suprapubic area. There is no rigidity, no rebound, no guarding and no CVA tenderness.  Tenderness is mild  Musculoskeletal: Normal range of motion.  Neurological: She is alert and oriented to person, place, and time. She has normal strength. No cranial nerve deficit or sensory deficit. Gait normal.  Skin: Skin is warm and dry.  Neurofibromatosis   Psychiatric: She has a normal mood and affect. Her behavior is normal.  Nursing note and vitals reviewed.   ED Course  Procedures (including critical care time) Labs Review Results for orders placed or performed during the hospital encounter of 02/02/15 (from the past 24 hour(s))  Urinalysis, Routine w reflex microscopic     Status: Abnormal   Collection Time:  02/02/15 10:20 AM  Result Value Ref Range   Color, Urine YELLOW YELLOW   APPearance HAZY (A) CLEAR   Specific Gravity, Urine 1.025 1.005 - 1.030   pH 5.5 5.0 - 8.0   Glucose, UA NEGATIVE NEGATIVE mg/dL   Hgb urine dipstick TRACE (A) NEGATIVE   Bilirubin Urine NEGATIVE NEGATIVE   Ketones, ur NEGATIVE NEGATIVE mg/dL   Protein, ur NEGATIVE NEGATIVE mg/dL   Urobilinogen, UA 0.2 0.0 - 1.0 mg/dL   Nitrite NEGATIVE NEGATIVE   Leukocytes, UA MODERATE (A) NEGATIVE  Urine microscopic-add on     Status: Abnormal   Collection Time: 02/02/15 10:20 AM  Result Value Ref Range   Squamous Epithelial / LPF FEW (A) RARE   WBC, UA 11-20 <3 WBC/hpf   RBC / HPF 3-6 <3 RBC/hpf   Bacteria, UA MANY (A) RARE     MDM  54 y.o. female with suprapubic tenderness and UTI symptoms for the past 2 days. Stable for discharge without pyelonephritis. Discussed with the patient lab and clinical findings and plan of care. All questioned fully answered. She will return if any problems arise.  Final diagnoses:  UTI (lower urinary tract infection)      Ashley Murrain, NP 02/02/15 [place], DO 02/04/15 1329

## 2015-02-02 NOTE — ED Notes (Signed)
Pt reports she has the same symptoms as her last UTI

## 2015-02-05 LAB — URINE CULTURE: Colony Count: 25000

## 2015-04-13 ENCOUNTER — Emergency Department (HOSPITAL_COMMUNITY)
Admission: EM | Admit: 2015-04-13 | Discharge: 2015-04-13 | Disposition: A | Discharge status: Home / Self Care | Payer: BLUE CROSS/BLUE SHIELD | Attending: Emergency Medicine | Admitting: Emergency Medicine

## 2015-04-13 ENCOUNTER — Encounter (HOSPITAL_COMMUNITY): Payer: Self-pay | Admitting: *Deleted

## 2015-04-13 DIAGNOSIS — Z87442 Personal history of urinary calculi: Secondary | ICD-10-CM | POA: Diagnosis not present

## 2015-04-13 DIAGNOSIS — Z720 Tobacco use: Secondary | ICD-10-CM | POA: Diagnosis not present

## 2015-04-13 DIAGNOSIS — N390 Urinary tract infection, site not specified: Secondary | ICD-10-CM | POA: Diagnosis not present

## 2015-04-13 DIAGNOSIS — Q8500 Neurofibromatosis, unspecified: Secondary | ICD-10-CM | POA: Diagnosis not present

## 2015-04-13 DIAGNOSIS — Z8719 Personal history of other diseases of the digestive system: Secondary | ICD-10-CM | POA: Insufficient documentation

## 2015-04-13 DIAGNOSIS — R1030 Lower abdominal pain, unspecified: Secondary | ICD-10-CM | POA: Diagnosis present

## 2015-04-13 LAB — URINALYSIS, ROUTINE W REFLEX MICROSCOPIC
Glucose, UA: NEGATIVE mg/dL
NITRITE: NEGATIVE
PH: 7 (ref 5.0–8.0)
Protein, ur: 30 mg/dL — AB
SPECIFIC GRAVITY, URINE: 1.02 (ref 1.005–1.030)
Urobilinogen, UA: 1 mg/dL (ref 0.0–1.0)

## 2015-04-13 LAB — URINE MICROSCOPIC-ADD ON

## 2015-04-13 MED ORDER — CEPHALEXIN 500 MG PO CAPS: 500.0000 mg | ORAL_CAPSULE | Freq: Four times a day (QID) | ORAL | Status: DC

## 2015-04-13 MED ORDER — FLUCONAZOLE 150 MG PO TABS: 150.0000 mg | ORAL_TABLET | Freq: Every day | ORAL | Status: DC

## 2015-04-13 NOTE — ED Provider Notes (Signed)
CSN: [id number]     Arrival date & time 04/13/15  1055 History   First MD Initiated Contact with Patient 04/13/15 1326     Chief Complaint  Patient presents with  . Abdominal Pain      HPI  Pt was seen at 1335. Per pt, c/o gradual onset and persistence of constant "UTI symptoms" for the past 3 days. Pt states "my typical UTI symptoms" include: "bad odor to my urine," N/V/D, and vague lower abd "cramping." Denies hematuria/dysuria, no flank pain, no vaginal bleeding/discharge, no black or blood in stools or emesis, no fevers, no rash. The symptoms have been associated with no other complaints. The patient has a significant history of similar symptoms previously, recently being evaluated for this complaint and multiple prior evals for same.     Past Medical History  Diagnosis Date  . Neurofibromatosis   . UTI (lower urinary tract infection)   . Kidney stones   . IBS (irritable bowel syndrome)   . Knee pain    Past Surgical History  Procedure Laterality Date  . Breast surgery    . Breast lumpectomy    . Cesarean section    . Cholecystectomy    . Abdominal hysterectomy      partial from fibroids   Family History  Problem Relation Age of Onset  . Arthritis Mother   . Cancer Mother     liver  . Arthritis Father   . Hypertension Father   . Stroke Father   . Other Other     arthralgia, multi sites  . Hypertension Other   . Cancer Other     breast; aunt   History  Substance Use Topics  . Smoking status: Current Some Day Smoker    Types: Cigarettes  . Smokeless tobacco: Not on file  . Alcohol Use: Yes     Comment: occ   OB History    Gravida Para Term Preterm AB TAB SAB Ectopic Multiple Living   4 4             Review of Systems ROS: Statement: All systems negative except as marked or noted in the HPI; Constitutional: Negative for fever and chills. ; ; Eyes: Negative for eye pain, redness and discharge. ; ; ENMT: Negative for ear pain, hoarseness, nasal congestion,  sinus pressure and sore throat. ; ; Cardiovascular: Negative for chest pain, palpitations, diaphoresis, dyspnea and peripheral edema. ; ; Respiratory: Negative for cough, wheezing and stridor. ; ; Gastrointestinal: +N/V/D, abd pain. Negative for blood in stool, hematemesis, jaundice and rectal bleeding. . ; ; Genitourinary: +"bad odor to urine." Negative for dysuria, flank pain and hematuria. ; ; GYN:  No vaginal bleeding, no vaginal discharge, no vulvar pain. ;; Musculoskeletal: Negative for back pain and neck pain. Negative for swelling and trauma.; ; Skin: Negative for pruritus, rash, abrasions, blisters, bruising and skin lesion.; ; Neuro: Negative for headache, lightheadedness and neck stiffness. Negative for weakness, altered level of consciousness , altered mental status, extremity weakness, paresthesias, involuntary movement, seizure and syncope.      Allergies  Review of patient's allergies indicates no known allergies.  Home Medications   Prior to Admission medications   Not on File   BP 137/83 mmHg  Pulse 86  Temp(Src) 98.7 F (37.1 C) (Oral)  Resp 16  Ht 5\' 5"  (1.651 m)  Wt 170 lb (77.111 kg)  BMI 28.29 kg/m2  SpO2 100% Physical Exam  1340: Physical examination:  Nursing notes reviewed; Vital signs  and O2 SAT reviewed;  Constitutional: Well developed, Well nourished, Well hydrated, In no acute distress; Head:  Normocephalic, atraumatic; Eyes: EOMI, PERRL, No scleral icterus; ENMT: Mouth and pharynx normal, Mucous membranes moist; Neck: Supple, Full range of motion, No lymphadenopathy; Cardiovascular: Regular rate and rhythm, No murmur, rub, or gallop; Respiratory: Breath sounds clear & equal bilaterally, No rales, rhonchi, wheezes.  Speaking full sentences with ease, Normal respiratory effort/excursion; Chest: Nontender, Movement normal; Abdomen: Soft, Nontender, Nondistended, Normal bowel sounds; Genitourinary: No CVA tenderness; Extremities: Pulses normal, No tenderness, No  edema, No calf edema or asymmetry.; Neuro: AA&Ox3, Major CN grossly intact.  Speech clear. No gross focal motor or sensory deficits in extremities. Climbs on and off stretcher easily by herself. Gait steady.; Skin: Color normal, Warm, Dry.   ED Course  Procedures     EKG Interpretation None      MDM  MDM Reviewed: previous chart, nursing note and vitals Interpretation: labs      Results for orders placed or performed during the hospital encounter of 04/13/15  Urinalysis, Routine w reflex microscopic  Result Value Ref Range   Color, Urine YELLOW YELLOW   APPearance HAZY (A) CLEAR   Specific Gravity, Urine 1.020 1.005 - 1.030   pH 7.0 5.0 - 8.0   Glucose, UA NEGATIVE NEGATIVE mg/dL   Hgb urine dipstick TRACE (A) NEGATIVE   Bilirubin Urine SMALL (A) NEGATIVE   Ketones, ur TRACE (A) NEGATIVE mg/dL   Protein, ur 30 (A) NEGATIVE mg/dL   Urobilinogen, UA 1.0 0.0 - 1.0 mg/dL   Nitrite NEGATIVE NEGATIVE   Leukocytes, UA LARGE (A) NEGATIVE  Urine microscopic-add on  Result Value Ref Range   Squamous Epithelial / LPF FEW (A) RARE   WBC, UA 11-20 <3 WBC/hpf   RBC / HPF 7-10 <3 RBC/hpf   Bacteria, UA FEW (A) RARE    1435:  Pt does not want any further testing and wants to leave now. States she "knows these aren't typical UTI symptoms but they are for me and I know what I have." States she "just came here for an antibiotic and a diflucan prescription." Abd benign, no CVAT bilat. Will tx for UTI. Dx and testing d/w pt.  Questions answered.  Verb understanding, agreeable to d/c home with outpt f/u.     Francine Graven, DO 04/18/15 1954

## 2015-04-13 NOTE — Discharge Instructions (Signed)
°Emergency Department Resource Guide °1) Find a Doctor and Pay Out of Pocket °Although you won't have to find out who is covered by your insurance plan, it is a good idea to ask around and get recommendations. You will then need to call the office and see if the doctor you have chosen will accept you as a new patient and what types of options they offer for patients who are self-pay. Some doctors offer discounts or will set up payment plans for their patients who do not have insurance, but you will need to ask so you aren't surprised when you get to your appointment. ° °2) Contact Your Local Health Department °Not all health departments have doctors that can see patients for sick visits, but many do, so it is worth a call to see if yours does. If you don't know where your local health department is, you can check in your phone book. The CDC also has a tool to help you locate your state's health department, and many state websites also have listings of all of their local health departments. ° °3) Find a Walk-in Clinic °If your illness is not likely to be very severe or complicated, you may want to try a walk in clinic. These are popping up all over the country in pharmacies, drugstores, and shopping centers. They're usually staffed by nurse practitioners or physician assistants that have been trained to treat common illnesses and complaints. They're usually fairly quick and inexpensive. However, if you have serious medical issues or chronic medical problems, these are probably not your best option. ° °No Primary Care Doctor: °- Call [facility] at  [phone] - they can help you locate a primary care doctor that  accepts your insurance, provides certain services, etc. °- Physician Referral Service- 1-800-533-3463 ° °Chronic Pain Problems: °Organization         Address  Phone   Notes  ° [facility]  ([phone]) [phone] Patients need to be referred by their primary care doctor.  ° °Medication  Assistance: °Organization         Address  Phone   Notes  °[place] [facility] [address] °[place], [place] [address] (336) 641-8030 --Must be a resident of [place] °-- Must have NO insurance coverage whatsoever (no Medicaid/ Medicare, etc.) °-- The pt. MUST have a primary care doctor that directs their care regularly and follows them in the community °  °[facility]  (866) 331-1348   °[employer]  (888) 892-1162   ° °Agencies that provide inexpensive medical care: °Organization         Address  Phone   Notes  °[facility]  (336) 832-8035   °[facility]    (336) 832-7272   °[facility] [address] °[place], [place] [address] (336) 832-4777   °[facility] [address], °[place] (336) 271-4999   °[facility]    (336) 373-0678   °[facility]    (336) 272-1050   °[facility] ° [address], [place] Phone:  (336) 832-4444, Fax:  (336) 832-4440 Hours of Operation:  9 am - 6 pm, M-F.  Also accepts Medicaid/Medicare and self-pay.  °[facility] ° [address], [place] Phone: (336) [phone], Fax: (336) [phone]. Hours of Operation:  8:30 am - 5:30 pm, M-F.  Also accepts Medicaid and self-pay.  °[facility] [place] [address], [place] Phone: (336) 878-6027   °[facility] [address], [place], [place] (336)723-1848, Ext. 123 Mondays & Thursdays: 7-9 AM.  First 15 patients are seen on a first come, first serve basis. °  ° °Medicaid-accepting [place] Providers: ° °Organization         Address  Phone   Notes  °[facility] [address], [place] (336) [phone] Also accepts self-pay patients.  °[facility] [address], [place] ° (336) [phone]   °[facility] [address], [place]  (336) 288-8857   °[facility] [address] [place] [address], [place] (336) 299-7000   °Ve[facility] [address], [place]  ° (336) [phone] Only accepts [place] Access Medicaid patients after they have their name applied to their card.  ° °Self-Pay (no insurance) in [place]: ° °Organization         Address  Phone   Notes  °Sickle Cell Patients, [facility] [address], [place] (336) 832-1970   °[facility] [address], [place] (336) 832-4400   °[facility] [place] ° [address], [place] (336) 992-4800   °[facility]/Dr. Osei-Bonsu ° [address] [place] [address], [place] or [address], [place] (336) [phone] Phone number for both [place] and [place] locations is the same.  °[facility] [address], [place] (336) 299-0000   °[facility] [address] [place] [address], [place] or [address] (336) 852-7530 °(336) 878-2260   °[facility] [address], [place] (336) 350-1642, phone; (336) 294-5005, fax Sees patients 1st and 3rd Saturday of every month.  Must not qualify for public or private insurance (i.e. Medicaid, Medicare, [place] Health Choice, Veterans' Benefits) • Household income should be no more than 200% of the poverty level •The clinic cannot treat you if you are pregnant or think you are pregnant • Sexually transmitted diseases are not treated at the clinic.  ° ° °Dental Care: °Organization         Address  Phone  Notes  °[place] [facility] [address], [place] (336) 641-6152 Accepts children up to age 21 who are enrolled in Medicaid or [place] Health Choice; pregnant women with a Medicaid card; and children who have applied for Medicaid or [place] Health Choice, but were declined, whose parents can pay a reduced fee at time of service.  °[place]  [facility] [place]  [address], [place] (336) 641-7733 Accepts children up to age 21 who are enrolled in Medicaid or [place] Health Choice; pregnant women with a Medicaid card; and children who have applied for Medicaid or [place] Health Choice, but were declined, whose parents can pay a reduced fee at time of service.  °[facility] ° [address], [place] (336) 641-4533 Patients are seen by appointment only. Walk-ins are not accepted. [facility] will see patients 18 years of age and older. °Monday - Tuesday (8am-5pm) °Most Wednesdays (8:30-5pm) °$30 per visit, cash only  °[facility] ° [address], [place] (336) 641-4533 Patients are seen by appointment only. Walk-ins are not accepted. [facility] will see patients 18 years of age and older. °One   Wednesday Evening (Monthly: Volunteer Based).  $30 per visit, cash only  °[facility]  (919) [phone] for adults; Children under age 4, call Graduate Pediatric Dentistry at ([phone]) [phone]. Children aged 4-14, please call (919) [phone] to request a pediatric application. ° Dental services are provided in all areas of dental care including fillings, crowns and bridges, complete and partial dentures, implants, gum treatment, root canals, and extractions. Preventive care is also provided. Treatment is provided to both adults and children. °Patients are selected via a lottery and there is often a waiting list. °  °[facility] [address], °[place] ° (336) 763-8833 www.drcivils.[email or web address] °  °[facility] [address], [place], [place] (336)723-1848, Ext. 123 Second and Fourth Thursday of each month, opens at 6:30 AM; Clinic ends at 9 AM.  Patients are seen on a first-come first-served basis, and a limited number are seen during each clinic.  ° °[facility] ° [address], [place], [place] (336) [phone]    Eligibility Requirements °You must have lived in [place], [place], or [place] counties for at least the last three months. °  You cannot be eligible for state or federal sponsored healthcare insurance, including Veterans Administration, Medicaid, or Medicare. °  You generally cannot be eligible for healthcare insurance through your employer.  °  How to apply: °Eligibility screenings are held every Tuesday and Wednesday afternoon from 1:00 pm until 4:00 pm. You do not need an appointment for the interview!  °[facility] [address], [place], [employer] 336-631-2330   °[facility]  336-342-8273   °[facility]  336-703-3100   °[facility]  336-570-6415   ° °Behavioral Health Resources in the Community: °Intensive Outpatient Programs °Organization         Address  Phone  Notes  °[facility] [address], [place], [place] 336-878-6098   °[facility] [address], [place], [place] 336-832-9800   °[facility] [address], [place], [place] ° 336-882-2125   °[facility] [address],  °[place], [place] 1-800-853-5163 or 336-641-4981   °Substance Abuse Resources °Organization         Address  Phone  Notes  °[facility]  336-882-2125   °[facility]  336-784-9470   °[facility]  336-285-9073   °[facility]  336-845-3988   °Residential & Outpatient Substance Abuse Program  1-800-659-3381   °Psychological Services °Organization         Address  Phone  Notes  °[facility]  336- 832-9600   °[facility]  336- 378-7881   °[facility] [address], [place] 1-800-853-5163 or 336-641-4981   ° °Mobile Crisis Teams °Organization         Address  Phone  Notes  °[facility], Mobile Crisis Care Unit  1-877-626-1772   °Assertive °[facility] ° [address]  [place], [place] 336-834-9664   °Sharon DeEsch [address] °[place] 336-554-5454   ° °Self-Help/Support Groups °Organization         Address  Phone             Notes  °[facility] [place] - variety of support groups  336- 373-1402 Call for more information  °Narcotics Anonymous (NA), [facility] [address], °[place]   2 meetings at this location  ° °  Residential Treatment Programs Organization         Address  Phone  Notes  [facility] [address]    [place]  1-972 541 1066   [facility]  [address] [place] [address] [place] [address], [place], [place]   [place], [place] [phone] Admissions: 8am-3pm M-F  [facility] [place] [address]    [place], [place] [phone]   [facility] [address] [place], [place], [place]   [facility] [address],  [place], [place]   [facility] Intensive Outpatient [address], [place], [place]   [facility] ([place]  [place], [place] 1-6204003509 or 704-238-3649   [facility] [address] [place], [place] Accepts Medicaid  [facility] [address]  [place] 1-5404413109 Substance Abuse/Addiction Treatment   [facility]         Address  Phone  Notes  [facility]  386-006-8379   Domenic Schwab, PhD [address] [place], [place]   607-200-2716 or (272)474-4480   [facility] [place], [place] (906)048-1660   [facility] [address] [place], [place] [phone] Insurance/Medicaid/sponsorship through [facility] [address]                                    [place], [place] [phone] [place] [address], [place] [phone]    Dr. Adele Schilder  [phone]   [facility] Dept. 1) [address] [place] 2) [place] 3)  [place] [address], [place] [phone]  [phone]   [place] [phone] or [phone] (After Hours)      Take the prescriptions as directed.  Call your regular medical doctor on Tuesday to schedule a follow up appointment this week.  Return to the Emergency Department immediately sooner if worsening.

## 2015-04-13 NOTE — ED Notes (Signed)
2 day history of R side pain and lower abdominal pain w/nausea, vomiting.  Denies fevers/chills.  Patient states this is how UTIs usually present in her. Denies dysuria, burning, stinging w/urination.

## 2015-05-08 ENCOUNTER — Encounter (HOSPITAL_COMMUNITY): Payer: Self-pay | Admitting: Emergency Medicine

## 2015-05-08 ENCOUNTER — Emergency Department (HOSPITAL_COMMUNITY)
Admission: EM | Admit: 2015-05-08 | Discharge: 2015-05-08 | Disposition: A | Discharge status: Home / Self Care | Payer: BLUE CROSS/BLUE SHIELD | Attending: Emergency Medicine | Admitting: Emergency Medicine

## 2015-05-08 DIAGNOSIS — R630 Anorexia: Secondary | ICD-10-CM | POA: Insufficient documentation

## 2015-05-08 DIAGNOSIS — Z8719 Personal history of other diseases of the digestive system: Secondary | ICD-10-CM | POA: Diagnosis not present

## 2015-05-08 DIAGNOSIS — Z9049 Acquired absence of other specified parts of digestive tract: Secondary | ICD-10-CM | POA: Diagnosis not present

## 2015-05-08 DIAGNOSIS — Q8500 Neurofibromatosis, unspecified: Secondary | ICD-10-CM | POA: Diagnosis not present

## 2015-05-08 DIAGNOSIS — Z87442 Personal history of urinary calculi: Secondary | ICD-10-CM | POA: Diagnosis not present

## 2015-05-08 DIAGNOSIS — Z792 Long term (current) use of antibiotics: Secondary | ICD-10-CM | POA: Diagnosis not present

## 2015-05-08 DIAGNOSIS — N390 Urinary tract infection, site not specified: Secondary | ICD-10-CM | POA: Insufficient documentation

## 2015-05-08 DIAGNOSIS — Z720 Tobacco use: Secondary | ICD-10-CM | POA: Diagnosis not present

## 2015-05-08 DIAGNOSIS — R110 Nausea: Secondary | ICD-10-CM

## 2015-05-08 DIAGNOSIS — R112 Nausea with vomiting, unspecified: Secondary | ICD-10-CM | POA: Diagnosis present

## 2015-05-08 LAB — CBC WITH DIFFERENTIAL/PLATELET
Basophils Absolute: 0.1 10*3/uL (ref 0.0–0.1)
Basophils Relative: 1 % (ref 0–1)
EOS ABS: 0.1 10*3/uL (ref 0.0–0.7)
Eosinophils Relative: 1 % (ref 0–5)
HCT: 41.4 % (ref 36.0–46.0)
Hemoglobin: 14.7 g/dL (ref 12.0–15.0)
LYMPHS ABS: 1.3 10*3/uL (ref 0.7–4.0)
Lymphocytes Relative: 10 % — ABNORMAL LOW (ref 12–46)
MCH: 29.2 pg (ref 26.0–34.0)
MCHC: 35.5 g/dL (ref 30.0–36.0)
MCV: 82.3 fL (ref 78.0–100.0)
Monocytes Absolute: 0.7 10*3/uL (ref 0.1–1.0)
Monocytes Relative: 5 % (ref 3–12)
NEUTROS PCT: 83 % — AB (ref 43–77)
Neutro Abs: 10.5 10*3/uL — ABNORMAL HIGH (ref 1.7–7.7)
Platelets: 233 10*3/uL (ref 150–400)
RBC: 5.03 MIL/uL (ref 3.87–5.11)
RDW: 14 % (ref 11.5–15.5)
WBC: 12.6 10*3/uL — ABNORMAL HIGH (ref 4.0–10.5)

## 2015-05-08 LAB — URINE MICROSCOPIC-ADD ON

## 2015-05-08 LAB — URINALYSIS, ROUTINE W REFLEX MICROSCOPIC
Glucose, UA: NEGATIVE mg/dL
Ketones, ur: 15 mg/dL — AB
LEUKOCYTES UA: NEGATIVE
Nitrite: NEGATIVE
PROTEIN: 30 mg/dL — AB
Specific Gravity, Urine: >1.03 — ABNORMAL HIGH (ref 1.005–1.030)
UROBILINOGEN UA: 1 mg/dL (ref 0.0–1.0)
pH: 5 (ref 5.0–8.0)

## 2015-05-08 LAB — COMPREHENSIVE METABOLIC PANEL
ALBUMIN: 3.9 g/dL (ref 3.5–5.0)
ALT: 12 U/L — ABNORMAL LOW (ref 14–54)
ANION GAP: 10 (ref 5–15)
AST: 16 U/L (ref 15–41)
Alkaline Phosphatase: 66 U/L (ref 38–126)
BILIRUBIN TOTAL: 1.2 mg/dL (ref 0.3–1.2)
BUN: 18 mg/dL (ref 6–20)
CALCIUM: 9.2 mg/dL (ref 8.9–10.3)
CO2: 27 mmol/L (ref 22–32)
CREATININE: 0.87 mg/dL (ref 0.44–1.00)
Chloride: 102 mmol/L (ref 101–111)
GFR calc Af Amer: >60 mL/min (ref >60)
GFR calc non Af Amer: >60 mL/min (ref >60)
Glucose, Bld: 97 mg/dL (ref 65–99)
POTASSIUM: 3.7 mmol/L (ref 3.5–5.1)
SODIUM: 139 mmol/L (ref 135–145)
Total Protein: 7.2 g/dL (ref 6.5–8.1)

## 2015-05-08 LAB — LIPASE, BLOOD: LIPASE: 14 U/L — AB (ref 22–51)

## 2015-05-08 MED ORDER — CEPHALEXIN 500 MG PO CAPS: 500.0000 mg | ORAL_CAPSULE | Freq: Four times a day (QID) | ORAL | Status: DC

## 2015-05-08 MED ORDER — ONDANSETRON HCL 4 MG PO TABS: 4.0000 mg | ORAL_TABLET | Freq: Four times a day (QID) | ORAL | Status: DC

## 2015-05-08 MED ORDER — CEPHALEXIN 500 MG PO CAPS
500.0000 mg | ORAL_CAPSULE | Freq: Once | ORAL | Status: AC
  Administered 2015-05-08: 500 mg via ORAL
  Filled 2015-05-08: qty 1

## 2015-05-08 MED ORDER — ONDANSETRON 4 MG PO TBDP
4.0000 mg | ORAL_TABLET | Freq: Once | ORAL | Status: AC
  Administered 2015-05-08: 4 mg via ORAL
  Filled 2015-05-08: qty 1

## 2015-05-08 NOTE — ED Notes (Signed)
Pt reporting nausea that started last night.  Reports vomiting once today.  C/o lower abdominal cramping.

## 2015-05-08 NOTE — ED Notes (Signed)
Pt c/o n/v all day. Pt states she left work and needs a work note.

## 2015-05-08 NOTE — Discharge Instructions (Signed)

## 2015-05-08 NOTE — ED Provider Notes (Signed)
Temp 98.4. Pulse 96 No vomiting since zofran given.  I have reviewed labs with patient. WBC elevated at 12.6. No shift to the left. Cmet wnl. Lipase low at 14, UA - Hazy yellow sample with SpGrav >1.030, trace hgb, ketones elevated at 15, WBC elevated 11-20 with many bacteria.  Doubt pyelonephritis. No evidence for sepsis. Culture sent to the lab. Rx for keflex and zofran given. Pt will return to the ED if any changes, problems or concerns. Discussed symptoms to return to ED.   Lily Kocher, PA-C 05/08/15 [place], MD 05/08/15 2308

## 2015-05-08 NOTE — ED Provider Notes (Signed)
CSN: [id number]     Arrival date & time 05/08/15  2044 History   First MD Initiated Contact with Patient 05/08/15 2101     Chief Complaint  Patient presents with  . Nausea     (Consider location/radiation/quality/duration/timing/severity/associated sxs/prior Treatment) HPI Comments:  Patient states nausea and vomiting that onset last night. States she's had 4-5 episodes of vomiting today they have been nonbilious and nonbloody. Endorses diffuse abdominal cramping. She denies fever. States normal bowel movement today. Denies any urinary symptoms. She was treated for UTI last month. No hematuria or dysuria. No vaginal bleeding or discharge. She endorses diffuse crampy abdominal pain similar to what she has had previously. No fever. No sick contacts or recent travel. States she needs a note for work.  The history is provided by the patient.    Past Medical History  Diagnosis Date  . Neurofibromatosis   . UTI (lower urinary tract infection)   . Kidney stones   . IBS (irritable bowel syndrome)   . Knee pain    Past Surgical History  Procedure Laterality Date  . Breast surgery    . Breast lumpectomy    . Cesarean section    . Cholecystectomy     Family History  Problem Relation Age of Onset  . Arthritis Mother   . Cancer Mother     liver  . Arthritis Father   . Hypertension Father   . Stroke Father   . Other Other     arthralgia, multi sites  . Hypertension Other   . Cancer Other     breast; aunt   History  Substance Use Topics  . Smoking status: Current Some Day Smoker    Types: Cigarettes  . Smokeless tobacco: Not on file  . Alcohol Use: Yes     Comment: occ   OB History    Gravida Para Term Preterm AB TAB SAB Ectopic Multiple Living   4 4             Review of Systems  Constitutional: Positive for activity change and appetite change. Negative for fever and fatigue.  HENT: Negative for congestion and rhinorrhea.   Respiratory: Negative for cough, chest  tightness and shortness of breath.   Cardiovascular: Negative for chest pain.  Gastrointestinal: Positive for nausea, vomiting and abdominal pain. Negative for diarrhea and constipation.  Genitourinary: Negative for dysuria, hematuria, vaginal bleeding and vaginal discharge.  Musculoskeletal: Negative for myalgias and arthralgias.  Skin: Negative for rash.  Neurological: Negative for dizziness, weakness, light-headedness and headaches.  A complete 10 system review of systems was obtained and all systems are negative except as noted in the HPI and PMH.      Allergies  Review of patient's allergies indicates no known allergies.  Home Medications   Prior to Admission medications   Medication Sig Start Date End Date Taking? Authorizing Provider  cephALEXin (KEFLEX) 500 MG capsule Take 1 capsule (500 mg total) by mouth 4 (four) times daily. 04/13/15   Francine Graven, DO  fluconazole (DIFLUCAN) 150 MG tablet Take 1 tablet (150 mg total) by mouth daily. 04/13/15   Francine Graven, DO   BP 145/85 mmHg  Pulse 101  Temp(Src) 98.4 F (36.9 C) (Oral)  Resp 20  Ht 5\' 5"  (1.651 m)  Wt 180 lb (81.647 kg)  BMI 29.95 kg/m2  SpO2 100% Physical Exam  Constitutional: She is oriented to person, place, and time. She appears well-developed and well-nourished. No distress.  neurofibromatosis  HENT:  Head:  Normocephalic and atraumatic.  Mouth/Throat: Oropharynx is clear and moist. No oropharyngeal exudate.  Eyes: Conjunctivae and EOM are normal. Pupils are equal, round, and reactive to light.  Neck: Normal range of motion. Neck supple.  No meningismus.  Cardiovascular: Normal rate, regular rhythm, normal heart sounds and intact distal pulses.   No murmur heard. Pulmonary/Chest: Effort normal and breath sounds normal. No respiratory distress.  Abdominal: Soft. There is no tenderness. There is no rebound and no guarding.  Musculoskeletal: Normal range of motion. She exhibits no edema or tenderness.   Neurological: She is alert and oriented to person, place, and time. No cranial nerve deficit. She exhibits normal muscle tone. Coordination normal.  No ataxia on finger to nose bilaterally. No pronator drift. 5/5 strength throughout. CN 2-12 intact. Negative Romberg. Equal grip strength. Sensation intact. Gait is normal.   Skin: Skin is warm.  Psychiatric: She has a normal mood and affect. Her behavior is normal.  Nursing note and vitals reviewed.   ED Course  Procedures (including critical care time) Labs Review Labs Reviewed  URINALYSIS, ROUTINE W REFLEX MICROSCOPIC (NOT AT [facility])  CBC WITH DIFFERENTIAL/PLATELET  COMPREHENSIVE METABOLIC PANEL  LIPASE, BLOOD    Imaging Review No results found.   EKG Interpretation None      MDM   Final diagnoses:  None   Nausea and vomiting that onset last night.  Told me 5-6 episodes of vomiting today. Told nurse 1 episode.  Normal BM. No fever.  No UTI symptoms.  Diffuse crampy abdominal pain.  Abdomen soft, no peritoneal signs. UA consistent with infection  Hx SBO in past.  No obstructive symptoms today.  Normal BMs. No significant abdominal pain.  Labs pending. Tolerating PO in the ED. PA Marshell Levan to disposition when they return.   Kristy Essex, MD 05/08/15 2245

## 2015-05-10 LAB — URINE CULTURE

## 2015-05-30 ENCOUNTER — Encounter (HOSPITAL_COMMUNITY): Payer: Self-pay | Admitting: *Deleted

## 2015-05-30 ENCOUNTER — Emergency Department (HOSPITAL_COMMUNITY)
Admission: EM | Admit: 2015-05-30 | Discharge: 2015-05-30 | Disposition: A | Discharge status: Home / Self Care | Payer: BLUE CROSS/BLUE SHIELD | Attending: Emergency Medicine | Admitting: Emergency Medicine

## 2015-05-30 DIAGNOSIS — Y998 Other external cause status: Secondary | ICD-10-CM | POA: Diagnosis not present

## 2015-05-30 DIAGNOSIS — Y93G3 Activity, cooking and baking: Secondary | ICD-10-CM | POA: Insufficient documentation

## 2015-05-30 DIAGNOSIS — Q8500 Neurofibromatosis, unspecified: Secondary | ICD-10-CM | POA: Insufficient documentation

## 2015-05-30 DIAGNOSIS — Z792 Long term (current) use of antibiotics: Secondary | ICD-10-CM | POA: Diagnosis not present

## 2015-05-30 DIAGNOSIS — Z87442 Personal history of urinary calculi: Secondary | ICD-10-CM | POA: Insufficient documentation

## 2015-05-30 DIAGNOSIS — Z8719 Personal history of other diseases of the digestive system: Secondary | ICD-10-CM | POA: Insufficient documentation

## 2015-05-30 DIAGNOSIS — Y92030 Kitchen in apartment as the place of occurrence of the external cause: Secondary | ICD-10-CM | POA: Insufficient documentation

## 2015-05-30 DIAGNOSIS — Z8739 Personal history of other diseases of the musculoskeletal system and connective tissue: Secondary | ICD-10-CM | POA: Diagnosis not present

## 2015-05-30 DIAGNOSIS — Z720 Tobacco use: Secondary | ICD-10-CM | POA: Diagnosis not present

## 2015-05-30 DIAGNOSIS — S61219A Laceration without foreign body of unspecified finger without damage to nail, initial encounter: Secondary | ICD-10-CM

## 2015-05-30 DIAGNOSIS — Z87440 Personal history of urinary (tract) infections: Secondary | ICD-10-CM | POA: Diagnosis not present

## 2015-05-30 DIAGNOSIS — S61216A Laceration without foreign body of right little finger without damage to nail, initial encounter: Secondary | ICD-10-CM | POA: Diagnosis present

## 2015-05-30 DIAGNOSIS — W260XXA Contact with knife, initial encounter: Secondary | ICD-10-CM | POA: Insufficient documentation

## 2015-05-30 MED ORDER — SILVER NITRATE-POT NITRATE 75-25 % EX MISC
CUTANEOUS | Status: AC
  Administered 2015-05-30: 17:00:00
  Filled 2015-05-30: qty 1

## 2015-05-30 NOTE — ED Provider Notes (Signed)
CSN: [id number]     Arrival date & time 05/30/15  1523 History   None    Chief Complaint  Patient presents with  . Extremity Laceration     (Consider location/radiation/quality/duration/timing/severity/associated sxs/prior Treatment) Patient is a 54 y.o. female presenting with skin laceration. The history is provided by the patient.  Laceration Location:  Finger Finger laceration location:  R little finger Depth:  Through dermis Quality: avulsion   Bleeding: controlled with pressure   Laceration mechanism:  Knife Pain details:    Quality:  Dull   Severity:  Mild   Timing:  Constant   Progression:  Improving Foreign body present:  No foreign bodies Relieved by:  Pressure Worsened by:  Nothing tried Tetanus status:  Up to date  Kristy Christian is a 54 y.o. female who presents to the ED with a laceration to the right little finger. She was using a meat cleaver at home and cut the tip of her finger. She is up to date on tetanus. Past Medical History  Diagnosis Date  . Neurofibromatosis   . UTI (lower urinary tract infection)   . Kidney stones   . IBS (irritable bowel syndrome)   . Knee pain    Past Surgical History  Procedure Laterality Date  . Breast surgery    . Breast lumpectomy    . Cesarean section    . Cholecystectomy     Family History  Problem Relation Age of Onset  . Arthritis Mother   . Cancer Mother     liver  . Arthritis Father   . Hypertension Father   . Stroke Father   . Other Other     arthralgia, multi sites  . Hypertension Other   . Cancer Other     breast; aunt   History  Substance Use Topics  . Smoking status: Current Some Day Smoker    Types: Cigarettes  . Smokeless tobacco: Not on file  . Alcohol Use: Yes     Comment: occ   OB History    Gravida Para Term Preterm AB TAB SAB Ectopic Multiple Living   4 4             Review of Systems Negative except as stated in HPI   Allergies  Review of patient's allergies indicates no known  allergies.  Home Medications   Prior to Admission medications   Medication Sig Start Date End Date Taking? Authorizing Provider  cephALEXin (KEFLEX) 500 MG capsule Take 1 capsule (500 mg total) by mouth 4 (four) times daily. 05/08/15   [name], MD  fluconazole (DIFLUCAN) 150 MG tablet Take 1 tablet (150 mg total) by mouth daily. Patient not taking: Reported on 05/08/2015 04/13/15   Francine Graven, DO  ondansetron (ZOFRAN) 4 MG tablet Take 1 tablet (4 mg total) by mouth every 6 (six) hours. 05/08/15   [name], MD  oxymetazoline (NASAL SPRAY 12 HOUR) 0.05 % nasal spray Place 1 spray into both nostrils 2 (two) times daily as needed for congestion.    Historical Provider, MD   BP 134/79 mmHg  Pulse 103  Temp(Src) 99.1 F (37.3 C) (Oral)  Resp 16  Ht 5\' 5"  (1.651 m)  Wt 180 lb (81.647 kg)  BMI 29.95 kg/m2  SpO2 95% Physical Exam  Constitutional: She is oriented to person, place, and time. She appears well-developed and well-nourished.  Eyes: Conjunctivae and EOM are normal.  Neck: Neck supple.  Cardiovascular: Normal rate.   Pulmonary/Chest: Effort normal.  Musculoskeletal: Normal  range of motion.       Right hand: She exhibits laceration. She exhibits normal range of motion. Normal sensation noted. Normal strength noted.       Hands: Avulsion laceration to the tip of the right hand, fifth digit.   Neurological: She is alert and oriented to person, place, and time. No cranial nerve deficit.  Skin: Skin is warm and dry.  Psychiatric: She has a normal mood and affect. Her behavior is normal.  Nursing note and vitals reviewed.   ED Course  Procedures (including critical care time) Wound cleaned, quick clot and pressure dressing applied.  Patient tolerated procedure without complications.   Labs Review Labs Reviewed - No data to display  Imaging Review No results found.   EKG Interpretation None      MDM  54 y.o. female with laceration of the right little  finger s/p injury while using a knife at home. Stable for d/c with bleeding controlled. No focal neuro deficits. Discussed with the patient and all questioned fully answered. She will return if any problems arise.   Final diagnoses:  Laceration of finger, initial encounter      [facility], NP 05/30/15 1629  Milton Ferguson, MD 06/03/15 1357

## 2015-05-30 NOTE — ED Notes (Signed)
Pt given discharge instructions and work note- Verbalized understanding and ambulated off unit with self

## 2015-05-30 NOTE — ED Notes (Signed)
Cut R 5th finger w/meat cleaver at home. Wrapped w/pressure dressing. States it is still bleeding.

## 2015-08-26 ENCOUNTER — Emergency Department (HOSPITAL_COMMUNITY)
Admission: EM | Admit: 2015-08-26 | Discharge: 2015-08-26 | Disposition: A | Discharge status: Home / Self Care | Payer: Self-pay | Attending: Physician Assistant | Admitting: Physician Assistant

## 2015-08-26 ENCOUNTER — Encounter (HOSPITAL_COMMUNITY): Payer: Self-pay | Admitting: Emergency Medicine

## 2015-08-26 DIAGNOSIS — Q8500 Neurofibromatosis, unspecified: Secondary | ICD-10-CM | POA: Insufficient documentation

## 2015-08-26 DIAGNOSIS — R112 Nausea with vomiting, unspecified: Secondary | ICD-10-CM | POA: Insufficient documentation

## 2015-08-26 DIAGNOSIS — N390 Urinary tract infection, site not specified: Secondary | ICD-10-CM | POA: Insufficient documentation

## 2015-08-26 DIAGNOSIS — Z8719 Personal history of other diseases of the digestive system: Secondary | ICD-10-CM | POA: Insufficient documentation

## 2015-08-26 DIAGNOSIS — Z720 Tobacco use: Secondary | ICD-10-CM | POA: Insufficient documentation

## 2015-08-26 DIAGNOSIS — Z792 Long term (current) use of antibiotics: Secondary | ICD-10-CM | POA: Insufficient documentation

## 2015-08-26 DIAGNOSIS — Z87442 Personal history of urinary calculi: Secondary | ICD-10-CM | POA: Insufficient documentation

## 2015-08-26 LAB — URINALYSIS, ROUTINE W REFLEX MICROSCOPIC
Bilirubin Urine: NEGATIVE
Glucose, UA: NEGATIVE mg/dL
Ketones, ur: NEGATIVE mg/dL
Nitrite: NEGATIVE
PH: 5.5 (ref 5.0–8.0)
Protein, ur: NEGATIVE mg/dL
SPECIFIC GRAVITY, URINE: 1.025 (ref 1.005–1.030)
Urobilinogen, UA: 0.2 mg/dL (ref 0.0–1.0)

## 2015-08-26 LAB — URINE MICROSCOPIC-ADD ON

## 2015-08-26 MED ORDER — SULFAMETHOXAZOLE-TRIMETHOPRIM 800-160 MG PO TABS: 1.0000 | ORAL_TABLET | Freq: Two times a day (BID) | ORAL | Status: AC

## 2015-08-26 MED ORDER — FLUCONAZOLE 200 MG PO TABS: 200.0000 mg | ORAL_TABLET | Freq: Once | ORAL | Status: AC

## 2015-08-26 MED ORDER — SULFAMETHOXAZOLE-TRIMETHOPRIM 800-160 MG PO TABS
1.0000 | ORAL_TABLET | Freq: Once | ORAL | Status: AC
  Administered 2015-08-26: 1 via ORAL
  Filled 2015-08-26: qty 1

## 2015-08-26 NOTE — ED Provider Notes (Deleted)
CSN: [id number]     Arrival date & time 08/26/15  1004 History   First MD Initiated Contact with Patient 08/26/15 1106     Chief Complaint  Patient presents with  . Recurrent UTI     (Consider location/radiation/quality/duration/timing/severity/associated sxs/prior Treatment) HPI  Past Medical History  Diagnosis Date  . Neurofibromatosis ([place])   . UTI (lower urinary tract infection)   . Kidney stones   . IBS (irritable bowel syndrome)   . Knee pain    Past Surgical History  Procedure Laterality Date  . Breast surgery    . Breast lumpectomy    . Cesarean section    . Cholecystectomy     Family History  Problem Relation Age of Onset  . Arthritis Mother   . Cancer Mother     liver  . Arthritis Father   . Hypertension Father   . Stroke Father   . Other Other     arthralgia, multi sites  . Hypertension Other   . Cancer Other     breast; aunt   Social History  Substance Use Topics  . Smoking status: Current Some Day Smoker -- 0.50 packs/day    Types: Cigarettes  . Smokeless tobacco: None  . Alcohol Use: Yes     Comment: occ   OB History    Gravida Para Term Preterm AB TAB SAB Ectopic Multiple Living   4 4             Review of Systems    Allergies  Review of patient's allergies indicates no known allergies.  Home Medications   Prior to Admission medications   Medication Sig Start Date End Date Taking? Authorizing Provider  cephALEXin (KEFLEX) 500 MG capsule Take 1 capsule (500 mg total) by mouth 4 (four) times daily. 05/08/15   [name], MD  fluconazole (DIFLUCAN) 150 MG tablet Take 1 tablet (150 mg total) by mouth daily. Patient not taking: Reported on 05/08/2015 04/13/15   Francine Graven, DO  ondansetron (ZOFRAN) 4 MG tablet Take 1 tablet (4 mg total) by mouth every 6 (six) hours. 05/08/15   [name], MD  oxymetazoline (NASAL SPRAY 12 HOUR) 0.05 % nasal spray Place 1 spray into both nostrils 2 (two) times daily as needed for congestion.     Historical Provider, MD   BP 130/76 mmHg  Pulse 84  Temp(Src) 97.7 F (36.5 C) (Oral)  Resp 18  Ht 5\' 5"  (1.651 m)  Wt 180 lb (81.647 kg)  BMI 29.95 kg/m2  SpO2 100% Physical Exam  ED Course  Procedures (including critical care time) Labs Review Labs Reviewed  URINALYSIS, ROUTINE W REFLEX MICROSCOPIC (NOT AT [facility]) - Abnormal; Notable for the following:    Hgb urine dipstick SMALL (*)    Leukocytes, UA TRACE (*)    All other components within normal limits  URINE MICROSCOPIC-ADD ON - Abnormal; Notable for the following:    Squamous Epithelial / LPF FEW (*)    All other components within normal limits    Imaging Review No results found. I have personally reviewed and evaluated these images and lab results as part of my medical decision-making.   EKG Interpretation None      MDM   Final diagnoses:  None    Patient is a 54 year old female presenting with recurrent UTIs. Patient states that she always has the same symptoms as vague abdominal pain. She's had no new sex partners no discharge. She is requesting Bactrim for her UTI as well as  Diflucan because her antibiotics usually give her a yeast infection.  Patient currently searching for a primary care provider.    Kristy Julio Alm, MD 08/26/15 1147

## 2015-08-26 NOTE — ED Provider Notes (Signed)
CSN: [id number]     Arrival date & time 08/26/15  1004 History  By signing my name below, I, Kristy Christian, attest that this documentation has been prepared under the direction and in the presence of Alyria Krack Julio Alm, MD. Electronically Signed: Terressa Christian, ED Scribe. 08/26/2015. 11:22 AM.  Chief Complaint  Patient presents with  . Recurrent UTI   The history is provided by the patient. No language interpreter was used.   PCP: No PCP Per Patient HPI Comments: Kristy Christian is a 54 y.o. female, with PMHx noted below including neurofibromatosis, Hx of UTIs (3 UTIs in the past year), who presents to the Emergency Department complaining of dysuria with associated frequency, nausea and diarrhea onset two weeks ago. Pt notes her current Sx are consistent with prior UTIs. Pt reports bactrim has been effective to treat prior UTIs. Pt denies vomiting, back pain, flank pain, or any other Sx at this time.   Past Medical History  Diagnosis Date  . Neurofibromatosis ([place])   . UTI (lower urinary tract infection)   . Kidney stones   . IBS (irritable bowel syndrome)   . Knee pain    Past Surgical History  Procedure Laterality Date  . Breast surgery    . Breast lumpectomy    . Cesarean section    . Cholecystectomy     Family History  Problem Relation Age of Onset  . Arthritis Mother   . Cancer Mother     liver  . Arthritis Father   . Hypertension Father   . Stroke Father   . Other Other     arthralgia, multi sites  . Hypertension Other   . Cancer Other     breast; aunt   Social History  Substance Use Topics  . Smoking status: Current Some Day Smoker -- 0.50 packs/day    Types: Cigarettes  . Smokeless tobacco: None  . Alcohol Use: Yes     Comment: occ   OB History    Gravida Para Term Preterm AB TAB SAB Ectopic Multiple Living   4 4             Review of Systems  Constitutional: Negative for fever.  Gastrointestinal: Positive for nausea and vomiting.  Genitourinary:  Positive for dysuria and frequency.  All other systems reviewed and are negative.  Allergies  Review of patient's allergies indicates no known allergies.  Home Medications   Prior to Admission medications   Medication Sig Start Date End Date Taking? Authorizing Provider  cephALEXin (KEFLEX) 500 MG capsule Take 1 capsule (500 mg total) by mouth 4 (four) times daily. 05/08/15   [name], MD  fluconazole (DIFLUCAN) 150 MG tablet Take 1 tablet (150 mg total) by mouth daily. Patient not taking: Reported on 05/08/2015 04/13/15   Francine Graven, DO  ondansetron (ZOFRAN) 4 MG tablet Take 1 tablet (4 mg total) by mouth every 6 (six) hours. 05/08/15   [name], MD  oxymetazoline (NASAL SPRAY 12 HOUR) 0.05 % nasal spray Place 1 spray into both nostrils 2 (two) times daily as needed for congestion.    Historical Provider, MD   Triage Vitals: BP 130/76 mmHg  Pulse 84  Temp(Src) 97.7 F (36.5 C) (Oral)  Resp 18  Ht 5\' 5"  (1.651 m)  Wt 180 lb (81.647 kg)  BMI 29.95 kg/m2  SpO2 100% Physical Exam  Constitutional: She is oriented to person, place, and time. She appears well-developed and well-nourished.  HENT:  Head: Normocephalic.  Eyes: EOM are  normal.  Neck: Normal range of motion.  Cardiovascular: Normal rate and regular rhythm.   Pulmonary/Chest: Effort normal.  Abdominal: She exhibits no distension.  Musculoskeletal: Normal range of motion.  Neurological: She is alert and oriented to person, place, and time.  Skin:  Multiple neurofibromatosis diffusely.   Psychiatric: She has a normal mood and affect.  Nursing note and vitals reviewed.   ED Course  Procedures (including critical care time) DIAGNOSTIC STUDIES: Oxygen Saturation is 100% on RA, nl by my interpretation.    COORDINATION OF CARE: 11:26 AM: Discussed treatment plan which includes discussing UA results and Bactrim with pt at bedside; patient verbalizes understanding and agrees with treatment plan.  Labs  Review Labs Reviewed  URINALYSIS, ROUTINE W REFLEX MICROSCOPIC (NOT AT [facility]) - Abnormal; Notable for the following:    Hgb urine dipstick SMALL (*)    Leukocytes, UA TRACE (*)    All other components within normal limits  URINE MICROSCOPIC-ADD ON - Abnormal; Notable for the following:    Squamous Epithelial / LPF FEW (*)    All other components within normal limits   I have personally reviewed and evaluated these lab results as part of my medical decision-making.   MDM   Final diagnoses:  None    Patient is a 54 year old female presenting with recurrent UTIs. Patient states that she always has the same symptoms as vague abdominal pain. She's had no new sex partners no discharge. She is requesting Bactrim for her UTI as well as Diflucan because her antibiotics usually give her a yeast infection.    Patient currently searching for a primary care provider.   I, [place], personally performed the services described in this documentation. All medical record entries made by the scribe were at my direction and in my presence.  I have reviewed the chart and discharge instructions and agree that the record reflects my personal performance and is accurate and complete. [place].  08/26/2015. 11:57 AM.     Macarthur Critchley, MD 08/26/15 1157

## 2015-08-26 NOTE — ED Notes (Signed)
Pt states she has a UTI. States she has nausea and diarrhea and that is what happens when she has a UTI

## 2015-08-26 NOTE — ED Notes (Signed)
PT c/o recurrent UTI and began having urinary burning and increased frequency x1 week ago.

## 2015-10-31 ENCOUNTER — Ambulatory Visit: Payer: Self-pay | Admitting: Physician Assistant

## 2015-12-05 ENCOUNTER — Ambulatory Visit: Payer: Self-pay | Admitting: Physician Assistant

## 2015-12-11 ENCOUNTER — Encounter: Payer: Self-pay | Admitting: Physician Assistant

## 2016-01-23 ENCOUNTER — Encounter: Payer: Self-pay | Admitting: Physician Assistant

## 2016-01-23 ENCOUNTER — Ambulatory Visit: Payer: Self-pay | Admitting: Physician Assistant

## 2016-01-23 VITALS — BP 118/84 | HR 95 | Temp 98.1°F | Ht 64.75 in | Wt 190.7 lb

## 2016-01-23 DIAGNOSIS — Z131 Encounter for screening for diabetes mellitus: Secondary | ICD-10-CM

## 2016-01-23 DIAGNOSIS — N3090 Cystitis, unspecified without hematuria: Secondary | ICD-10-CM

## 2016-01-23 DIAGNOSIS — K219 Gastro-esophageal reflux disease without esophagitis: Secondary | ICD-10-CM

## 2016-01-23 DIAGNOSIS — Q8500 Neurofibromatosis, unspecified: Secondary | ICD-10-CM

## 2016-01-23 DIAGNOSIS — R300 Dysuria: Secondary | ICD-10-CM

## 2016-01-23 DIAGNOSIS — Z13220 Encounter for screening for lipoid disorders: Secondary | ICD-10-CM

## 2016-01-23 DIAGNOSIS — S61219A Laceration without foreign body of unspecified finger without damage to nail, initial encounter: Secondary | ICD-10-CM

## 2016-01-23 LAB — GLUCOSE, POCT (MANUAL RESULT ENTRY): POC Glucose: 105 mg/dl — AB (ref 70–99)

## 2016-01-23 LAB — POCT URINALYSIS DIPSTICK
GLUCOSE UA: NEGATIVE
Nitrite, UA: NEGATIVE
Protein, UA: 30
Spec Grav, UA: >=1.03
Urobilinogen, UA: 2
pH, UA: 5

## 2016-01-23 MED ORDER — CIPROFLOXACIN HCL 500 MG PO TABS: 500.0000 mg | ORAL_TABLET | Freq: Two times a day (BID) | ORAL | Status: DC

## 2016-01-23 NOTE — Patient Instructions (Signed)

## 2016-01-23 NOTE — Progress Notes (Signed)
BP 118/84 mmHg  Pulse 95  Temp(Src) 98.1 F (36.7 C)  Ht 5' 4.75" (1.645 m)  Wt 190 lb 11.2 oz (86.501 kg)  BMI 31.97 kg/m2  SpO2 99%   Subjective:    Patient ID: Kristy Christian, female    DOB: [birth year], 55 y.o.   MRN: [id number]  HPI: Kristy Christian is a 55 y.o. female presenting on 01/23/2016 for New Patient (Initial Visit); Gastroesophageal Reflux; and Dysuria   HPI   Pt states frequent UTI.  States it is dark and has odor now.  She has taken otc for her GERD but not lately b/c she can't afford them.   Relevant past medical, surgical, family and social history reviewed and updated as indicated. Interim medical history since our last visit reviewed. Allergies and medications reviewed and updated.  CURRENT MEDS: none  Review of Systems  Constitutional: Positive for appetite change and fatigue. Negative for fever, chills, diaphoresis and unexpected weight change.  HENT: Positive for dental problem. Negative for congestion, drooling, ear pain, facial swelling, hearing loss, mouth sores, sneezing, sore throat, trouble swallowing and voice change.   Eyes: Negative for pain, discharge, redness, itching and visual disturbance.  Respiratory: Negative for cough, choking, shortness of breath and wheezing.   Cardiovascular: Positive for leg swelling. Negative for chest pain and palpitations.  Gastrointestinal: Positive for abdominal pain, diarrhea and constipation. Negative for vomiting and blood in stool.  Endocrine: Negative for cold intolerance, heat intolerance and polydipsia.  Genitourinary: Negative for dysuria, hematuria and decreased urine volume.  Musculoskeletal: Positive for arthralgias. Negative for back pain and gait problem.  Skin: Negative for rash.  Allergic/Immunologic: Negative for environmental allergies.  Neurological: Negative for seizures, syncope, light-headedness and headaches.  Hematological: Negative for adenopathy.  Psychiatric/Behavioral: Positive for dysphoric  mood. Negative for suicidal ideas and agitation. The patient is not nervous/anxious.     Per HPI unless specifically indicated above     Objective:    BP 118/84 mmHg  Pulse 95  Temp(Src) 98.1 F (36.7 C)  Ht 5' 4.75" (1.645 m)  Wt 190 lb 11.2 oz (86.501 kg)  BMI 31.97 kg/m2  SpO2 99%  Wt Readings from Last 3 Encounters:  01/23/16 190 lb 11.2 oz (86.501 kg)  08/26/15 180 lb (81.647 kg)  05/30/15 180 lb (81.647 kg)    Physical Exam  Constitutional: She is oriented to person, place, and time. She appears well-developed and well-nourished.  HENT:  Head: Normocephalic and atraumatic.  Mouth/Throat: Oropharynx is clear and moist. No oropharyngeal exudate.  Eyes: Conjunctivae and EOM are normal. Pupils are equal, round, and reactive to light.  Neck: Neck supple. No thyromegaly present.  Cardiovascular: Normal rate and regular rhythm.   Pulmonary/Chest: Effort normal and breath sounds normal.  Abdominal: Soft. Bowel sounds are normal. She exhibits no mass. There is no hepatosplenomegaly. There is no tenderness.  Musculoskeletal: She exhibits no edema.       Right hand: She exhibits laceration. She exhibits normal range of motion.  Several days old laceration palmar surface R 3rd finger, extending from dip to pip.  Scabbing in some areas, still oozing blood other areas.  No redness or pus.  + n/v.  Normal flexion/extension  Lymphadenopathy:    She has no cervical adenopathy.  Neurological: She is alert and oriented to person, place, and time. Gait normal.  Skin: Skin is warm and dry.  Psychiatric: She has a normal mood and affect. Her behavior is normal.  Vitals reviewed.  Assessment & Plan:    Encounter Diagnoses  Name Primary?  . Neurofibromatosis ([place])   . Gastroesophageal reflux disease, esophagitis presence not specified   . Screening for diabetes mellitus Yes  . Dysuria   . Screening cholesterol level   . Cystitis   . Laceration of finger, initial encounter       -Get baseline labs -Sign up for medassist- order omeprazole -finger wound cleaned, bacitracin applied, and dressed -rx cipro for urine -F/u 1 month

## 2016-01-27 DIAGNOSIS — Q8500 Neurofibromatosis, unspecified: Secondary | ICD-10-CM | POA: Insufficient documentation

## 2016-01-27 LAB — COMPLETE METABOLIC PANEL WITH GFR
ALT: 9 U/L (ref 6–29)
AST: 8 U/L — AB (ref 10–35)
Albumin: 3.9 g/dL (ref 3.6–5.1)
Alkaline Phosphatase: 68 U/L (ref 33–130)
BUN: 12 mg/dL (ref 7–25)
CALCIUM: 8.9 mg/dL (ref 8.6–10.4)
CO2: 28 mmol/L (ref 20–31)
Chloride: 104 mmol/L (ref 98–110)
Creat: 0.78 mg/dL (ref 0.50–1.05)
GFR, Est Non African American: 86 mL/min (ref >=60)
Glucose, Bld: 83 mg/dL (ref 65–99)
POTASSIUM: 4.1 mmol/L (ref 3.5–5.3)
Sodium: 138 mmol/L (ref 135–146)
Total Bilirubin: 0.4 mg/dL (ref 0.2–1.2)
Total Protein: 5.9 g/dL — ABNORMAL LOW (ref 6.1–8.1)

## 2016-01-27 LAB — LIPID PANEL
CHOL/HDL RATIO: 3.3 ratio (ref <=5.0)
Cholesterol: 176 mg/dL (ref 125–200)
HDL: 53 mg/dL (ref >=46)
LDL CALC: 101 mg/dL (ref <130)
TRIGLYCERIDES: 108 mg/dL (ref <150)
VLDL: 22 mg/dL (ref <30)

## 2016-01-27 MED ORDER — OMEPRAZOLE 40 MG PO CPDR: 40.0000 mg | DELAYED_RELEASE_CAPSULE | Freq: Every day | ORAL | Status: DC

## 2016-01-28 ENCOUNTER — Other Ambulatory Visit: Payer: Self-pay | Admitting: Physician Assistant

## 2016-01-28 LAB — CBC WITH DIFFERENTIAL/PLATELET
Basophils Absolute: 0.1 K/uL (ref 0.0–0.1)
Basophils Relative: 1 % (ref 0–1)
Eosinophils Absolute: 0.2 K/uL (ref 0.0–0.7)
Eosinophils Relative: 3 % (ref 0–5)
HCT: 38.6 % (ref 36.0–46.0)
Hemoglobin: 13.1 g/dL (ref 12.0–15.0)
Lymphocytes Relative: 18 % (ref 12–46)
Lymphs Abs: 1.1 K/uL (ref 0.7–4.0)
MCH: 28.1 pg (ref 26.0–34.0)
MCHC: 33.9 g/dL (ref 30.0–36.0)
MCV: 82.8 fL (ref 78.0–100.0)
MPV: 9 fL (ref 8.6–12.4)
Monocytes Absolute: 0.6 K/uL (ref 0.1–1.0)
Monocytes Relative: 10 % (ref 3–12)
Neutro Abs: 4.2 K/uL (ref 1.7–7.7)
Neutrophils Relative %: 68 % (ref 43–77)
Platelets: 224 K/uL (ref 150–400)
RBC: 4.66 MIL/uL (ref 3.87–5.11)
RDW: 14.1 % (ref 11.5–15.5)
WBC: 6.2 K/uL (ref 4.0–10.5)

## 2016-01-28 MED ORDER — OMEPRAZOLE 40 MG PO CPDR: 40.0000 mg | DELAYED_RELEASE_CAPSULE | Freq: Every day | ORAL | Status: DC

## 2016-02-24 ENCOUNTER — Ambulatory Visit: Payer: Self-pay | Admitting: Physician Assistant

## 2016-02-24 ENCOUNTER — Encounter: Payer: Self-pay | Admitting: Physician Assistant

## 2016-02-24 VITALS — BP 124/82 | HR 94 | Temp 97.9°F | Ht 64.75 in | Wt 193.0 lb

## 2016-02-24 DIAGNOSIS — H1011 Acute atopic conjunctivitis, right eye: Secondary | ICD-10-CM

## 2016-02-24 NOTE — Progress Notes (Signed)
   BP 124/82 mmHg  Pulse 94  Temp(Src) 97.9 F (36.6 C)  Ht 5' 4.75" (1.645 m)  Wt 193 lb (87.544 kg)  BMI 32.35 kg/m2  SpO2 97%   Subjective:    Patient ID: Kristy Christian, female    DOB: [birth year], 55 y.o.   MRN: [id number]  HPI: Kristy Christian is a 55 y.o. female presenting on 02/24/2016 for Eye Problem   HPI  Chief Complaint  Patient presents with  . Eye Problem    right eye has been irritated since last night eye when dusting    Pt wears glasses only for reading. No contacts.  Relevant past medical, surgical, family and social history reviewed and updated as indicated. Interim medical history since our last visit reviewed. Allergies and medications reviewed and updated.   Current outpatient prescriptions:  .  omeprazole (PRILOSEC) 40 MG capsule, Take 1 capsule (40 mg total) by mouth daily. (Patient not taking: Reported on 02/24/2016), Disp: 90 capsule, Rfl: 1   Review of Systems  Constitutional: Positive for fatigue. Negative for fever, chills, diaphoresis and unexpected weight change.  HENT: Negative for congestion, dental problem, drooling, ear pain, facial swelling, hearing loss, mouth sores, sneezing, sore throat, trouble swallowing and voice change.   Eyes: Positive for discharge and itching. Negative for pain, redness and visual disturbance.  Respiratory: Negative for cough, choking, shortness of breath and wheezing.   Cardiovascular: Negative for chest pain, palpitations and leg swelling.  Gastrointestinal: Negative for vomiting, abdominal pain, diarrhea, constipation and blood in stool.  Endocrine: Negative for cold intolerance, heat intolerance and polydipsia.  Genitourinary: Negative for dysuria, hematuria and decreased urine volume.  Musculoskeletal: Negative for back pain, arthralgias and gait problem.  Skin: Negative for rash.  Allergic/Immunologic: Negative for environmental allergies.  Neurological: Negative for seizures, syncope, light-headedness and  headaches.  Hematological: Negative for adenopathy.  Psychiatric/Behavioral: Negative for suicidal ideas, dysphoric mood and agitation. The patient is not nervous/anxious.     Per HPI unless specifically indicated above     Objective:    BP 124/82 mmHg  Pulse 94  Temp(Src) 97.9 F (36.6 C)  Ht 5' 4.75" (1.645 m)  Wt 193 lb (87.544 kg)  BMI 32.35 kg/m2  SpO2 97%  Wt Readings from Last 3 Encounters:  02/24/16 193 lb (87.544 kg)  01/23/16 190 lb 11.2 oz (86.501 kg)  08/26/15 180 lb (81.647 kg)    Physical Exam  Constitutional: She is oriented to person, place, and time. She appears well-developed and well-nourished.  Eyes: Pupils are equal, round, and reactive to light. Lids are everted and swept, no foreign bodies found. Right eye exhibits no discharge, no exudate and no hordeolum. No foreign body present in the right eye. Right conjunctiva is injected. Right conjunctiva has no hemorrhage. Left conjunctiva is not injected. Left conjunctiva has no hemorrhage. Right eye exhibits normal extraocular motion and no nystagmus. Left eye exhibits normal extraocular motion and no nystagmus.  Fluorescein instilled OD. Exam under woods lamp reveals no abrasion or ulceration. Visual acuity checked and documented  Pulmonary/Chest: Effort normal.  Neurological: She is alert and oriented to person, place, and time.  Psychiatric: She has a normal mood and affect. Her behavior is normal.  Vitals reviewed.       Assessment & Plan:    Encounter Diagnosis  Name Primary?  . Conjunctivitis, acute atopic, right Yes     -Recommended otc zaditor eye drops -F/u as scheduled

## 2016-02-24 NOTE — Patient Instructions (Signed)
Over the counter zaditor drops

## 2016-02-26 ENCOUNTER — Ambulatory Visit: Payer: Self-pay | Admitting: Physician Assistant

## 2016-02-26 ENCOUNTER — Encounter: Payer: Self-pay | Admitting: Physician Assistant

## 2016-02-26 VITALS — BP 126/80 | HR 102 | Temp 97.7°F | Ht 64.75 in | Wt 206.5 lb

## 2016-02-26 DIAGNOSIS — K219 Gastro-esophageal reflux disease without esophagitis: Secondary | ICD-10-CM | POA: Insufficient documentation

## 2016-02-26 DIAGNOSIS — F17210 Nicotine dependence, cigarettes, uncomplicated: Secondary | ICD-10-CM | POA: Insufficient documentation

## 2016-02-26 DIAGNOSIS — E669 Obesity, unspecified: Secondary | ICD-10-CM

## 2016-02-26 DIAGNOSIS — Z1239 Encounter for other screening for malignant neoplasm of breast: Secondary | ICD-10-CM

## 2016-02-26 DIAGNOSIS — K589 Irritable bowel syndrome without diarrhea: Secondary | ICD-10-CM | POA: Insufficient documentation

## 2016-02-26 NOTE — Progress Notes (Signed)
BP 126/80 mmHg  Pulse 102  Temp(Src) 97.7 F (36.5 C)  Ht 5' 4.75" (1.645 m)  Wt 206 lb 8 oz (93.668 kg)  BMI 34.61 kg/m2  SpO2 99%   Subjective:    Patient ID: Kristy Christian, female    DOB: August 01, 1961, 55 y.o.   MRN: [id number]  HPI: Kristy Christian is a 55 y.o. female presenting on 02/26/2016 for Gastroesophageal Reflux; Irritable Bowel Syndrome; and Nutrition Counseling   HPI   Chief Complaint  Patient presents with  . Gastroesophageal Reflux    pt brought in her letter of support for [facility], pt has not started med  . Irritable Bowel Syndrome    pt wants to know if there is anything she can take for this    pt use to take diet pills when she was in [place] and wants to know if she could gets something rx again  Pt did not get her ppi yet b/c she didn't bring in the papers needed to get signed up for [facility] until today.   Relevant past medical, surgical, family and social history reviewed and updated as indicated. Interim medical history since our last visit reviewed. Allergies and medications reviewed and updated.  Current meds: None   Review of Systems  Constitutional: Positive for appetite change and fatigue. Negative for fever, chills, diaphoresis and unexpected weight change.  HENT: Positive for congestion and sore throat. Negative for dental problem, drooling, ear pain, facial swelling, hearing loss, mouth sores, sneezing, trouble swallowing and voice change.   Eyes: Positive for itching. Negative for pain, discharge, redness and visual disturbance.  Respiratory: Positive for cough. Negative for choking, shortness of breath and wheezing.   Cardiovascular: Negative for chest pain, palpitations and leg swelling.  Gastrointestinal: Positive for constipation. Negative for vomiting, abdominal pain, diarrhea and blood in stool.  Endocrine: Negative for cold intolerance, heat intolerance and polydipsia.  Genitourinary: Negative for dysuria, hematuria and decreased  urine volume.  Musculoskeletal: Negative for back pain, arthralgias and gait problem.  Skin: Negative for rash.  Allergic/Immunologic: Negative for environmental allergies.  Neurological: Negative for seizures, syncope, light-headedness and headaches.  Hematological: Negative for adenopathy.  Psychiatric/Behavioral: Negative for suicidal ideas, dysphoric mood and agitation. The patient is not nervous/anxious.     Per HPI unless specifically indicated above     Objective:    BP 126/80 mmHg  Pulse 102  Temp(Src) 97.7 F (36.5 C)  Ht 5' 4.75" (1.645 m)  Wt 206 lb 8 oz (93.668 kg)  BMI 34.61 kg/m2  SpO2 99%  Wt Readings from Last 3 Encounters:  02/26/16 206 lb 8 oz (93.668 kg)  02/24/16 193 lb (87.544 kg)  01/23/16 190 lb 11.2 oz (86.501 kg)    Physical Exam  Constitutional: She is oriented to person, place, and time. She appears well-developed and well-nourished.  HENT:  Head: Normocephalic and atraumatic.  Neck: Neck supple.  Cardiovascular: Normal rate and regular rhythm.   Pulmonary/Chest: Effort normal and breath sounds normal.  Abdominal: Soft. Bowel sounds are normal. She exhibits no mass. There is no hepatosplenomegaly. There is no tenderness.  Musculoskeletal: She exhibits no edema.  Lymphadenopathy:    She has no cervical adenopathy.  Neurological: She is alert and oriented to person, place, and time.  Skin: Skin is warm and dry.  Psychiatric: She has a normal mood and affect. Her behavior is normal.  Vitals reviewed.   Results for orders placed or performed in visit on 01/23/16  COMPLETE METABOLIC PANEL WITH  GFR  Result Value Ref Range   Sodium 138 135 - 146 mmol/L   Potassium 4.1 3.5 - 5.3 mmol/L   Chloride 104 98 - 110 mmol/L   CO2 28 20 - 31 mmol/L   Glucose, Bld 83 65 - 99 mg/dL   BUN 12 7 - 25 mg/dL   Creat 0.78 0.50 - 1.05 mg/dL   Total Bilirubin 0.4 0.2 - 1.2 mg/dL   Alkaline Phosphatase 68 33 - 130 U/L   AST 8 (L) 10 - 35 U/L   ALT 9 6 - 29 U/L    Total Protein 5.9 (L) 6.1 - 8.1 g/dL   Albumin 3.9 3.6 - 5.1 g/dL   Calcium 8.9 8.6 - 10.4 mg/dL   GFR, Est African American >89 >=60 mL/min   GFR, Est Non African American 86 >=60 mL/min  CBC w/Diff/Platelet  Result Value Ref Range   WBC 6.2 4.0 - 10.5 K/uL   RBC 4.66 3.87 - 5.11 MIL/uL   Hemoglobin 13.1 12.0 - 15.0 g/dL   HCT 38.6 36.0 - 46.0 %   MCV 82.8 78.0 - 100.0 fL   MCH 28.1 26.0 - 34.0 pg   MCHC 33.9 30.0 - 36.0 g/dL   RDW 14.1 11.5 - 15.5 %   Platelets 224 150 - 400 K/uL   MPV 9.0 8.6 - 12.4 fL   Neutrophils Relative % 68 43 - 77 %   Neutro Abs 4.2 1.7 - 7.7 K/uL   Lymphocytes Relative 18 12 - 46 %   Lymphs Abs 1.1 0.7 - 4.0 K/uL   Monocytes Relative 10 3 - 12 %   Monocytes Absolute 0.6 0.1 - 1.0 K/uL   Eosinophils Relative 3 0 - 5 %   Eosinophils Absolute 0.2 0.0 - 0.7 K/uL   Basophils Relative 1 0 - 1 %   Basophils Absolute 0.1 0.0 - 0.1 K/uL   Smear Review Criteria for review not met   Lipid Profile  Result Value Ref Range   Cholesterol 176 125 - 200 mg/dL   Triglycerides 108 <150 mg/dL   HDL 53 >=46 mg/dL   Total CHOL/HDL Ratio 3.3 <=5.0 Ratio   VLDL 22 <30 mg/dL   LDL Cholesterol 101 <130 mg/dL  POCT Glucose (CBG)  Result Value Ref Range   POC Glucose 105 (A) 70 - 99 mg/dl  POCT Urinalysis Dipstick  Result Value Ref Range   Color, UA TEA COLORED    Clarity, UA CLOUDY    Glucose, UA N    Bilirubin, UA SMALL    Ketones, UA TRACE    Spec Grav, UA >=1.030    Blood, UA TRACE-INTACT    pH, UA 5.0    Protein, UA 30    Urobilinogen, UA 2.0    Nitrite, UA N    Leukocytes, UA Trace (A) Negative      Assessment & Plan:   Encounter Diagnoses  Name Primary?  . Gastroesophageal reflux disease, esophagitis presence not specified Yes  . IBS (irritable bowel syndrome)   . Cigarette nicotine dependence without complication   . Screening for breast cancer   . Obesity, unspecified    -reviewed labs with pt -recommended  water and fiber for ibs -get on  omeprazole -Discussed diet pills- pt know s they are not safe. No rx diet pills will be given.  Counseled pt on weight loss with diet and exercise and gave handouts -order screening mammogram -counesled on smoking cessation -requested colonoscopy report (was done < 10 years ago) -  f/u 6 weeks to see how she is doing on the omeprazole

## 2016-02-26 NOTE — Patient Instructions (Signed)
Irritable Bowel Syndrome, Adult Irritable bowel syndrome (IBS) is not one specific disease. It is a group of symptoms that affects the organs responsible for digestion (gastrointestinal or GI tract).  To regulate how your GI tract works, your body sends signals back and forth between your intestines and your brain. If you have IBS, there may be a problem with these signals. As a result, your GI tract does not function normally. Your intestines may become more sensitive and overreact to certain things. This is especially true when you eat certain foods or when you are under stress.  There are four types of IBS. These may be determined based on the consistency of your stool:   IBS with diarrhea.   IBS with constipation.   Mixed IBS.   Unsubtyped IBS.  It is important to know which type of IBS you have. Some treatments are more likely to be helpful for certain types of IBS.  CAUSES  The exact cause of IBS is not known. RISK FACTORS You may have a higher risk of IBS if:  You are a woman.  You are younger than 55 years old.  You have a family history of IBS.  You have mental health problems.  You have had bacterial infection of your GI tract. SIGNS AND SYMPTOMS  Symptoms of IBS vary from person to person. The main symptom is abdominal pain or discomfort. Additional symptoms usually include one or more of the following:   Diarrhea, constipation, or both.   Abdominal swelling or bloating.   Feeling full or sick after eating a small or regular-size meal.   Frequent gas.   Mucus in the stool.   A feeling of having more stool left after a bowel movement.  Symptoms tend to come and go. They may be associated with stress, psychiatric conditions, or nothing at all.  DIAGNOSIS  There is no specific test to diagnose IBS. Your health care provider will make a diagnosis based on a physical exam, medical history, and your symptoms. You may have other tests to rule out other  conditions that may be causing your symptoms. These may include:   Blood tests.   X-rays.   CT scan.  Endoscopy and colonoscopy. This is a test in which your GI tract is viewed with a long, thin, flexible tube. TREATMENT There is no cure for IBS, but treatment can help relieve symptoms. IBS treatment often includes:   Changes to your diet, such as:  Eating more fiber.  Avoiding foods that cause symptoms.  Drinking more water.  Eating regular, medium-sized portioned meals.  Medicines. These may include:  Fiber supplements if you have constipation.  Medicine to control diarrhea (antidiarrheal medicines).  Medicine to help control muscle spasms in your GI tract (antispasmodic medicines).  Medicines to help with any mental health issues, such as antidepressants or tranquilizers.  Therapy.  Talk therapy may help with anxiety, depression, or other mental health issues that can make IBS symptoms worse.  Stress reduction.  Managing your stress can help keep symptoms under control. HOME CARE INSTRUCTIONS   Take medicines only as directed by your health care provider.  Eat a healthy diet.  Avoid foods and drinks with added sugar.  Include more whole grains, fruits, and vegetables gradually into your diet. This may be especially helpful if you have IBS with constipation.  Avoid any foods and drinks that make your symptoms worse. These may include dairy products and caffeinated or carbonated drinks.  Do not eat large meals.    Drink enough fluid to keep your urine clear or pale yellow.  Exercise regularly. Ask your health care provider for recommendations of good activities for you.  Keep all follow-up visits as directed by your health care provider. This is important. SEEK MEDICAL CARE IF:   You have constant pain.  You have trouble or pain with swallowing.  You have worsening diarrhea. SEEK IMMEDIATE MEDICAL CARE IF:   You have severe and worsening abdominal  pain.   You have diarrhea and:   You have a rash, stiff neck, or severe headache.   You are irritable, sleepy, or difficult to awaken.   You are weak, dizzy, or extremely thirsty.   You have bright red blood in your stool or you have black tarry stools.   You have unusual abdominal swelling that is painful.   You vomit continuously.   You vomit blood (hematemesis).   You have both abdominal pain and a fever.    This information is not intended to replace advice given to you by your health care provider. Make sure you discuss any questions you have with your health care provider.   Document Released: 11/02/2005 Document Revised: 11/23/2014 Document Reviewed: 07/20/2014 Elsevier Interactive Patient Education 2016 [employer].  Exercising to [employer] Exercising can help you to lose weight. In order to lose weight through exercise, you need to do vigorous-intensity exercise. You can tell that you are exercising with vigorous intensity if you are breathing very hard and fast and cannot hold a conversation while exercising. Moderate-intensity exercise helps to maintain your current weight. You can tell that you are exercising at a moderate level if you have a higher heart rate and faster breathing, but you are still able to hold a conversation. HOW OFTEN SHOULD I EXERCISE? Choose an activity that you enjoy and set realistic goals. Your health care provider can help you to make an activity plan that works for you. Exercise regularly as directed by your health care provider. This may include:  Doing resistance training twice each week, such as:  Push-ups.  Sit-ups.  Lifting weights.  Using resistance bands.  Doing a given intensity of exercise for a given amount of time. Choose from these options:  150 minutes of moderate-intensity exercise every week.  75 minutes of vigorous-intensity exercise every week.  A mix of moderate-intensity and vigorous-intensity  exercise every week. Children, pregnant women, people who are out of shape, people who are overweight, and older adults may need to consult a health care provider for individual recommendations. If you have any sort of medical condition, be sure to consult your health care provider before starting a new exercise program. WHAT ARE SOME ACTIVITIES THAT CAN HELP ME TO LOSE WEIGHT?   Walking at a rate of at least 4.5 miles an hour.  Jogging or running at a rate of 5 miles per hour.  Biking at a rate of at least 10 miles per hour.  Lap swimming.  Roller-skating or in-line skating.  Cross-country skiing.  Vigorous competitive sports, such as football, basketball, and soccer.  Jumping rope.  Aerobic dancing. HOW CAN I BE MORE ACTIVE IN MY DAY-TO-DAY ACTIVITIES?  Use the stairs instead of the elevator.  Take a walk during your lunch break.  If you drive, park your car farther away from work or school.  If you take public transportation, get off one stop early and walk the rest of the way.  Make all of your phone calls while standing up and walking  around.  Get up, stretch, and walk around every 30 minutes throughout the day. WHAT GUIDELINES SHOULD I FOLLOW WHILE EXERCISING?  Do not exercise so much that you hurt yourself, feel dizzy, or get very short of breath.  Consult your health care provider prior to starting a new exercise program.  Wear comfortable clothes and shoes with good support.  Drink plenty of water while you exercise to prevent dehydration or heat stroke. Body water is lost during exercise and must be replaced.  Work out until you breathe faster and your heart beats faster.   This information is not intended to replace advice given to you by your health care provider. Make sure you discuss any questions you have with your health care provider.   Document Released: 12/05/2010 Document Revised: 11/23/2014 Document Reviewed: 04/05/2014 Elsevier Interactive  Patient Education [employer].

## 2016-03-06 ENCOUNTER — Encounter (HOSPITAL_COMMUNITY): Payer: Self-pay

## 2016-03-09 ENCOUNTER — Telehealth: Payer: Self-pay | Admitting: Student

## 2016-03-09 ENCOUNTER — Ambulatory Visit (HOSPITAL_COMMUNITY): Admission: RE | Admit: 2016-03-09 | Payer: Self-pay | Source: Ambulatory Visit

## 2016-03-09 ENCOUNTER — Ambulatory Visit (HOSPITAL_COMMUNITY)
Admission: RE | Admit: 2016-03-09 | Discharge: 2016-03-09 | Disposition: A | Discharge status: Home / Self Care | Payer: Self-pay | Source: Ambulatory Visit | Attending: Physician Assistant | Admitting: Physician Assistant

## 2016-03-09 ENCOUNTER — Other Ambulatory Visit: Payer: Self-pay | Admitting: Physician Assistant

## 2016-03-09 DIAGNOSIS — Z1231 Encounter for screening mammogram for malignant neoplasm of breast: Secondary | ICD-10-CM

## 2016-03-09 NOTE — Telephone Encounter (Signed)
Called pt to notify her that [facility] got the report back on her colonoscopy back in 2009 showing she has Barrett's Esophagus and will need to come by the office to pick up a [employer] discount application for GI referral. Pt stated she will come by tomorrow to pick up application.

## 2016-03-10 ENCOUNTER — Encounter: Payer: Self-pay | Admitting: Internal Medicine

## 2016-03-11 ENCOUNTER — Ambulatory Visit: Payer: Self-pay | Admitting: Physician Assistant

## 2016-03-11 ENCOUNTER — Encounter: Payer: Self-pay | Admitting: Physician Assistant

## 2016-03-11 VITALS — BP 140/86 | HR 79 | Temp 98.1°F | Ht 64.75 in | Wt 200.6 lb

## 2016-03-11 DIAGNOSIS — J069 Acute upper respiratory infection, unspecified: Secondary | ICD-10-CM

## 2016-03-11 DIAGNOSIS — N3090 Cystitis, unspecified without hematuria: Secondary | ICD-10-CM

## 2016-03-11 DIAGNOSIS — R300 Dysuria: Secondary | ICD-10-CM

## 2016-03-11 LAB — POCT URINALYSIS DIPSTICK
BILIRUBIN UA: NEGATIVE
GLUCOSE UA: NEGATIVE
KETONES UA: NEGATIVE
Nitrite, UA: NEGATIVE
SPEC GRAV UA: 1.02
Urobilinogen, UA: 0.2
pH, UA: 5.5

## 2016-03-11 MED ORDER — SULFAMETHOXAZOLE-TRIMETHOPRIM 800-160 MG PO TABS: 1.0000 | ORAL_TABLET | Freq: Two times a day (BID) | ORAL | Status: DC

## 2016-03-11 NOTE — Patient Instructions (Signed)
Upper Respiratory Infection, Adult Most upper respiratory infections (URIs) are a viral infection of the air passages leading to the lungs. A URI affects the nose, throat, and upper air passages. The most common type of URI is nasopharyngitis and is typically referred to as "the common cold." URIs run their course and usually go away on their own. Most of the time, a URI does not require medical attention, but sometimes a bacterial infection in the upper airways can follow a viral infection. This is called a secondary infection. Sinus and middle ear infections are common types of secondary upper respiratory infections. Bacterial pneumonia can also complicate a URI. A URI can worsen asthma and chronic obstructive pulmonary disease (COPD). Sometimes, these complications can require emergency medical care and may be life threatening.  CAUSES Almost all URIs are caused by viruses. A virus is a type of germ and can spread from one person to another.  RISKS FACTORS You may be at risk for a URI if:   You smoke.   You have chronic heart or lung disease.  You have a weakened defense (immune) system.   You are very young or very old.   You have nasal allergies or asthma.  You work in crowded or poorly ventilated areas.  You work in health care facilities or schools. SIGNS AND SYMPTOMS  Symptoms typically develop 2-3 days after you come in contact with a cold virus. Most viral URIs last 7-10 days. However, viral URIs from the influenza virus (flu virus) can last 14-18 days and are typically more severe. Symptoms may include:   Runny or stuffy (congested) nose.   Sneezing.   Cough.   Sore throat.   Headache.   Fatigue.   Fever.   Loss of appetite.   Pain in your forehead, behind your eyes, and over your cheekbones (sinus pain).  Muscle aches.  DIAGNOSIS  Your health care provider may diagnose a URI by:  Physical exam.  Tests to check that your symptoms are not due to  another condition such as:  Strep throat.  Sinusitis.  Pneumonia.  Asthma. TREATMENT  A URI goes away on its own with time. It cannot be cured with medicines, but medicines may be prescribed or recommended to relieve symptoms. Medicines may help:  Reduce your fever.  Reduce your cough.  Relieve nasal congestion. HOME CARE INSTRUCTIONS   Take medicines only as directed by your health care provider.   Gargle warm saltwater or take cough drops to comfort your throat as directed by your health care provider.  Use a warm mist humidifier or inhale steam from a shower to increase air moisture. This may make it easier to breathe.  Drink enough fluid to keep your urine clear or pale yellow.   Eat soups and other clear broths and maintain good nutrition.   Rest as needed.   Return to work when your temperature has returned to normal or as your health care provider advises. You may need to stay home longer to avoid infecting others. You can also use a face mask and careful hand washing to prevent spread of the virus.  Increase the usage of your inhaler if you have asthma.   Do not use any tobacco products, including cigarettes, chewing tobacco, or electronic cigarettes. If you need help quitting, ask your health care provider. PREVENTION  The best way to protect yourself from getting a cold is to practice good hygiene.   Avoid oral or hand contact with people with cold   symptoms.   Wash your hands often if contact occurs.  There is no clear evidence that vitamin C, vitamin E, echinacea, or exercise reduces the chance of developing a cold. However, it is always recommended to get plenty of rest, exercise, and practice good nutrition.  SEEK MEDICAL CARE IF:   You are getting worse rather than better.   Your symptoms are not controlled by medicine.   You have chills.  You have worsening shortness of breath.  You have brown or red mucus.  You have yellow or brown nasal  discharge.  You have pain in your face, especially when you bend forward.  You have a fever.  You have swollen neck glands.  You have pain while swallowing.  You have white areas in the back of your throat. SEEK IMMEDIATE MEDICAL CARE IF:   You have severe or persistent:  Headache.  Ear pain.  Sinus pain.  Chest pain.  You have chronic lung disease and any of the following:  Wheezing.  Prolonged cough.  Coughing up blood.  A change in your usual mucus.  You have a stiff neck.  You have changes in your:  Vision.  Hearing.  Thinking.  Mood. MAKE SURE YOU:   Understand these instructions.  Will watch your condition.  Will get help right away if you are not doing well or get worse.   This information is not intended to replace advice given to you by your health care provider. Make sure you discuss any questions you have with your health care provider.   Document Released: 04/28/2001 Document Revised: 03/19/2015 Document Reviewed: 02/07/2014 Elsevier Interactive Patient Education 2016 Elsevier Inc.  

## 2016-03-11 NOTE — Progress Notes (Signed)
   BP 140/86 mmHg  Pulse 79  Temp(Src) 98.1 F (36.7 C)  Ht 5' 4.75" (1.645 m)  Wt 200 lb 9.6 oz (90.992 kg)  BMI 33.63 kg/m2  SpO2 99%   Subjective:    Patient ID: Kristy Christian, female    DOB: 1961-11-05, 55 y.o.   MRN: [id number]  HPI: Kristy Christian is a 55 y.o. female presenting on 03/11/2016 for Sore Throat   HPI Chief Complaint  Patient presents with  . Sore Throat    pain started 4-5 days ago, now both ears hurt    Pt also c/o dysuria. States dysuria never went away after treatment with cipro  Relevant past medical, surgical, family and social history reviewed and updated as indicated. Interim medical history since our last visit reviewed. Allergies and medications reviewed and updated.  Review of Systems  Constitutional: Positive for fatigue. Negative for fever, chills, diaphoresis, appetite change and unexpected weight change.  HENT: Positive for congestion, ear pain, sneezing and sore throat. Negative for dental problem, drooling, facial swelling, hearing loss and mouth sores.   Eyes: Positive for discharge. Negative for pain, itching and visual disturbance.  Respiratory: Positive for cough. Negative for shortness of breath and wheezing.   Cardiovascular: Negative for chest pain.  Genitourinary: Positive for dysuria.  Psychiatric/Behavioral: Negative for suicidal ideas, dysphoric mood and agitation. The patient is not nervous/anxious.     Per HPI unless specifically indicated above     Objective:    BP 140/86 mmHg  Pulse 79  Temp(Src) 98.1 F (36.7 C)  Ht 5' 4.75" (1.645 m)  Wt 200 lb 9.6 oz (90.992 kg)  BMI 33.63 kg/m2  SpO2 99%  Wt Readings from Last 3 Encounters:  03/11/16 200 lb 9.6 oz (90.992 kg)  02/26/16 206 lb 8 oz (93.668 kg)  02/24/16 193 lb (87.544 kg)    Physical Exam  Constitutional: She is oriented to person, place, and time. She appears well-developed and well-nourished.  HENT:  Head: Normocephalic and atraumatic.  Right Ear: Hearing,  tympanic membrane, external ear and ear canal normal.  Left Ear: Hearing, tympanic membrane, external ear and ear canal normal.  Nose: Nose normal.  Mouth/Throat: Uvula is midline and oropharynx is clear and moist. No oropharyngeal exudate.  Neck: Neck supple.  Cardiovascular: Normal rate and regular rhythm.   Pulmonary/Chest: Effort normal and breath sounds normal. She has no wheezes.  Abdominal: Soft. Bowel sounds are normal. She exhibits no mass. There is no hepatosplenomegaly. There is no tenderness. There is no CVA tenderness.  Lymphadenopathy:    She has no cervical adenopathy.  Neurological: She is alert and oriented to person, place, and time.  Skin: Skin is warm and dry.  Psychiatric: She has a normal mood and affect. Her behavior is normal.  Vitals reviewed.       Assessment & Plan:   Encounter Diagnoses  Name Primary?  Marland Kitchen Upper respiratory infection Yes  . Cystitis   . Dysuria     -Rx septra ds for urine. Will send for culture. -Counseled on symptomatic control for URI including rest, fluids, warm salt water gargles and OTCs prn. Gave handout -f/u as scheduled

## 2016-03-16 ENCOUNTER — Other Ambulatory Visit: Payer: Self-pay | Admitting: Physician Assistant

## 2016-03-16 DIAGNOSIS — R928 Other abnormal and inconclusive findings on diagnostic imaging of breast: Secondary | ICD-10-CM

## 2016-03-18 ENCOUNTER — Other Ambulatory Visit (HOSPITAL_COMMUNITY): Payer: Self-pay | Admitting: Physician Assistant

## 2016-03-18 DIAGNOSIS — R928 Other abnormal and inconclusive findings on diagnostic imaging of breast: Secondary | ICD-10-CM

## 2016-03-24 ENCOUNTER — Encounter (HOSPITAL_COMMUNITY): Payer: Self-pay

## 2016-03-31 ENCOUNTER — Encounter (HOSPITAL_COMMUNITY): Payer: Self-pay

## 2016-03-31 ENCOUNTER — Ambulatory Visit: Payer: Self-pay | Admitting: Gastroenterology

## 2016-04-08 ENCOUNTER — Encounter: Payer: Self-pay | Admitting: Physician Assistant

## 2016-04-08 ENCOUNTER — Ambulatory Visit: Payer: Self-pay | Admitting: Physician Assistant

## 2016-04-08 VITALS — BP 130/88 | HR 73 | Temp 97.7°F | Ht 64.75 in | Wt 201.0 lb

## 2016-04-08 DIAGNOSIS — E669 Obesity, unspecified: Secondary | ICD-10-CM

## 2016-04-08 DIAGNOSIS — K219 Gastro-esophageal reflux disease without esophagitis: Secondary | ICD-10-CM

## 2016-04-08 DIAGNOSIS — Q8500 Neurofibromatosis, unspecified: Secondary | ICD-10-CM

## 2016-04-08 DIAGNOSIS — F17210 Nicotine dependence, cigarettes, uncomplicated: Secondary | ICD-10-CM

## 2016-04-08 DIAGNOSIS — Z8719 Personal history of other diseases of the digestive system: Secondary | ICD-10-CM

## 2016-04-08 NOTE — Progress Notes (Signed)
BP 130/88 mmHg  Pulse 73  Temp(Src) 97.7 F (36.5 C)  Ht 5' 4.75" (1.645 m)  Wt 201 lb (91.173 kg)  BMI 33.69 kg/m2  SpO2 99%   Subjective:    Patient ID: Kristy Christian, female    DOB: [birth year], 55 y.o.   MRN: [id number]  HPI: Kristy Christian is a 55 y.o. female presenting on 04/08/2016 for Gastroesophageal Reflux   HPI   pt got her omeprazole from [facility] and it is working welll for her  Relevant past medical, surgical, family and social history reviewed and updated as indicated. Interim medical history since our last visit reviewed. Allergies and medications reviewed and updated.   Current outpatient prescriptions:  .  Cyanocobalamin (VITAMIN B 12 PO), Take 2 capsules by mouth daily., Disp: , Rfl:  .  Ginkgo Biloba (GINKOBA) 40 MG TABS, Take 1 tablet by mouth daily., Disp: , Rfl:  .  Omega-3 Fatty Acids (FISH OIL OMEGA-3) 1000 MG CAPS, Take 2 capsules by mouth daily., Disp: , Rfl:  .  omeprazole (PRILOSEC) 40 MG capsule, Take 1 capsule (40 mg total) by mouth daily., Disp: 90 capsule, Rfl: 1   Review of Systems  Constitutional: Positive for appetite change. Negative for fever, chills, diaphoresis, fatigue and unexpected weight change.  HENT: Negative for congestion, dental problem, drooling, ear pain, facial swelling, hearing loss, mouth sores, sneezing, sore throat, trouble swallowing and voice change.   Eyes: Negative for pain, discharge, redness, itching and visual disturbance.  Respiratory: Positive for cough. Negative for choking, shortness of breath and wheezing.   Cardiovascular: Negative for chest pain, palpitations and leg swelling.  Gastrointestinal: Positive for diarrhea and constipation. Negative for vomiting, abdominal pain and blood in stool.  Endocrine: Negative for cold intolerance, heat intolerance and polydipsia.  Genitourinary: Negative for dysuria, hematuria and decreased urine volume.  Musculoskeletal: Negative for back pain, arthralgias and gait  problem.  Skin: Negative for rash.  Allergic/Immunologic: Negative for environmental allergies.  Neurological: Negative for seizures, syncope, light-headedness and headaches.  Hematological: Negative for adenopathy.  Psychiatric/Behavioral: Negative for suicidal ideas, dysphoric mood and agitation. The patient is not nervous/anxious.     Per HPI unless specifically indicated above     Objective:    BP 130/88 mmHg  Pulse 73  Temp(Src) 97.7 F (36.5 C)  Ht 5' 4.75" (1.645 m)  Wt 201 lb (91.173 kg)  BMI 33.69 kg/m2  SpO2 99%  Wt Readings from Last 3 Encounters:  04/08/16 201 lb (91.173 kg)  03/11/16 200 lb 9.6 oz (90.992 kg)  02/26/16 206 lb 8 oz (93.668 kg)    Physical Exam  Constitutional: She is oriented to person, place, and time. She appears well-developed and well-nourished.  HENT:  Head: Normocephalic and atraumatic.  Neck: Neck supple.  Cardiovascular: Normal rate and regular rhythm.   Pulmonary/Chest: Effort normal and breath sounds normal.  Abdominal: Soft. Bowel sounds are normal. She exhibits no mass. There is no hepatosplenomegaly. There is no tenderness.  Musculoskeletal: She exhibits no edema.  Lymphadenopathy:    She has no cervical adenopathy.  Neurological: She is alert and oriented to person, place, and time.  Skin: Skin is warm and dry.  Psychiatric: She has a normal mood and affect. Her behavior is normal.  Vitals reviewed.       Assessment & Plan:    Encounter Diagnoses  Name Primary?  . Gastroesophageal reflux disease, esophagitis presence not specified Yes  . History of Barrett's esophagus   . Neurofibromatosis ([place])   .  Cigarette nicotine dependence without complication   . Obesity, unspecified     -Pt has been referred to GI for Barrett's esophagus- awaiting appointment -continue omeprazole for GERD -pt was referred to [facility] program for further evaluation of abnormal mammogram -f/u 4 months.  RTO sooner prn

## 2016-04-15 ENCOUNTER — Other Ambulatory Visit: Payer: Self-pay | Admitting: Physician Assistant

## 2016-04-15 ENCOUNTER — Ambulatory Visit: Payer: Self-pay | Admitting: Physician Assistant

## 2016-04-15 ENCOUNTER — Encounter: Payer: Self-pay | Admitting: Physician Assistant

## 2016-04-15 VITALS — BP 114/76 | HR 97 | Temp 97.9°F | Ht 64.75 in | Wt 194.0 lb

## 2016-04-15 DIAGNOSIS — N3090 Cystitis, unspecified without hematuria: Secondary | ICD-10-CM

## 2016-04-15 DIAGNOSIS — R300 Dysuria: Secondary | ICD-10-CM

## 2016-04-15 DIAGNOSIS — R1030 Lower abdominal pain, unspecified: Secondary | ICD-10-CM

## 2016-04-15 LAB — POCT URINALYSIS DIPSTICK
Glucose, UA: NEGATIVE
Ketones, UA: NEGATIVE
Leukocytes, UA: NEGATIVE
NITRITE UA: NEGATIVE
PH UA: 5.5
PROTEIN UA: 30
UROBILINOGEN UA: 1

## 2016-04-15 MED ORDER — SULFAMETHOXAZOLE-TRIMETHOPRIM 800-160 MG PO TABS: 1.0000 | ORAL_TABLET | Freq: Two times a day (BID) | ORAL | Status: DC

## 2016-04-15 NOTE — Progress Notes (Signed)
BP 114/76 mmHg  Pulse 97  Temp(Src) 97.9 F (36.6 C)  Ht 5' 4.75" (1.645 m)  Wt 194 lb (87.998 kg)  BMI 32.52 kg/m2  SpO2 98%   Subjective:    Patient ID: Kristy Christian, female    DOB: 1961-06-21, 55 y.o.   MRN: [id number]  HPI: Kristy Christian is a 55 y.o. female presenting on 04/15/2016 for Abdominal Pain   HPI   Chief Complaint  Patient presents with  . Abdominal Pain    started 3 days ago. little burning when urinating,      Pt not drinking much liquids.  She is not taking azo (urine orange).     Lab was supposed to culture urine 4/26 and they didn't.    Pt denies fevers  Discussed with pt that she declined to make an appointment with GI that I referred her to.  She says that she was really depressed that week because her phone broke.  Discussed with her that she needs to be seen and she needs to call and tell them that she does want to be seen.  She says she will.  Relevant past medical, surgical, family and social history reviewed and updated as indicated. Interim medical history since our last visit reviewed. Allergies and medications reviewed and updated.  CURRENT MEDS: Vitamin b-12 Ginkgo biloba Fish oil omeprazole  Review of Systems  Constitutional: Negative for fever, chills, diaphoresis, appetite change, fatigue and unexpected weight change.  HENT: Negative for congestion, dental problem, drooling, ear pain, facial swelling, hearing loss, mouth sores, sneezing, sore throat, trouble swallowing and voice change.   Eyes: Negative for pain, discharge, redness, itching and visual disturbance.  Respiratory: Negative for cough, choking, shortness of breath and wheezing.   Cardiovascular: Negative for chest pain, palpitations and leg swelling.  Gastrointestinal: Positive for abdominal pain and diarrhea. Negative for vomiting, constipation and blood in stool.  Endocrine: Negative for cold intolerance, heat intolerance and polydipsia.  Genitourinary: Positive for  dysuria. Negative for hematuria and decreased urine volume.  Musculoskeletal: Negative for back pain, arthralgias and gait problem.  Skin: Negative for rash.  Allergic/Immunologic: Negative for environmental allergies.  Neurological: Negative for seizures, syncope, light-headedness and headaches.  Hematological: Negative for adenopathy.  Psychiatric/Behavioral: Negative for suicidal ideas, dysphoric mood and agitation. The patient is not nervous/anxious.     Per HPI unless specifically indicated above     Objective:    BP 114/76 mmHg  Pulse 97  Temp(Src) 97.9 F (36.6 C)  Ht 5' 4.75" (1.645 m)  Wt 194 lb (87.998 kg)  BMI 32.52 kg/m2  SpO2 98%  Wt Readings from Last 3 Encounters:  04/15/16 194 lb (87.998 kg)  04/08/16 201 lb (91.173 kg)  03/11/16 200 lb 9.6 oz (90.992 kg)    Physical Exam  Constitutional: She is oriented to person, place, and time. She appears well-developed and well-nourished.  HENT:  Head: Normocephalic and atraumatic.  Neck: Neck supple.  Cardiovascular: Normal rate and regular rhythm.   Pulmonary/Chest: Effort normal and breath sounds normal.  Abdominal: Soft. Bowel sounds are normal. She exhibits no mass. There is no hepatosplenomegaly. There is tenderness in the suprapubic area. There is no rigidity, no rebound, no guarding and no CVA tenderness.  Musculoskeletal: She exhibits no edema.  Lymphadenopathy:    She has no cervical adenopathy.  Neurological: She is alert and oriented to person, place, and time.  Skin: Skin is warm and dry.  Psychiatric: She has a normal mood and affect. Her  behavior is normal.  Vitals reviewed.   Results for orders placed or performed in visit on 04/15/16  POCT Urinalysis Dipstick  Result Value Ref Range   Color, UA ORANGE    Clarity, UA CLOUDY    Glucose, UA N    Bilirubin, UA SMALL    Ketones, UA N    Spec Grav, UA >=1.030    Blood, UA TRACE-INTACT    pH, UA 5.5    Protein, UA 30    Urobilinogen, UA 1.0     Nitrite, UA N    Leukocytes, UA Negative Negative      Assessment & Plan:   Encounter Diagnoses  Name Primary?  . Lower abdominal pain Yes  . Dysuria   . Cystitis     Urine sent for culture Pt rx septra She is counseled to drink more water She is given telephone number for GI office and she is to call to get appointment scheduled F/u as scheduled.  RTO sooner prn

## 2016-04-15 NOTE — Patient Instructions (Signed)
Call [facility] about appointment -  [phone]

## 2016-04-17 LAB — CULTURE, URINE COMPREHENSIVE: Colony Count: >=100000

## 2016-04-30 ENCOUNTER — Encounter: Payer: Self-pay | Admitting: Physician Assistant

## 2016-05-14 ENCOUNTER — Telehealth: Payer: Self-pay | Admitting: Student

## 2016-05-14 NOTE — Telephone Encounter (Signed)
-----   Message from Soyla Dryer, [place] sent at 05/13/2016 12:23 PM EDT ----- Please contact pt and remind her that she needs to reschedule her GI appt (to evaluate her Barrett's esophagus). thanks

## 2016-05-14 NOTE — Telephone Encounter (Signed)
Called and spoke to pt about her getting appointment rescheduled with GI (pt was given contact info to reschedule on 04-15-16 OV). Pt stated she had not rescheduled appointment yet due to she lost the number to their office. Pt was then again given [facility]'s contact information. Pt stated she would call to get appointment rescheduled  Pt was also asked if she had f/u about her mammo with the [facility] program (pt was given contact information on 04-08-16 OV to reschedule no showed appt with [facility]). Pt stated she had not called to get her appointment rescheduled because she had also lost that phone number. Patient was also again given [facility] contact information and was instructed to call and ask for marnie to get her [facility] appointment rescheduled.

## 2016-06-11 ENCOUNTER — Encounter: Payer: Self-pay | Admitting: Physician Assistant

## 2016-06-11 ENCOUNTER — Ambulatory Visit: Payer: Self-pay | Admitting: Physician Assistant

## 2016-06-11 VITALS — BP 116/80 | HR 86 | Temp 97.7°F | Ht 64.75 in | Wt 194.2 lb

## 2016-06-11 DIAGNOSIS — N3000 Acute cystitis without hematuria: Secondary | ICD-10-CM

## 2016-06-11 DIAGNOSIS — R109 Unspecified abdominal pain: Secondary | ICD-10-CM

## 2016-06-11 DIAGNOSIS — Q8500 Neurofibromatosis, unspecified: Secondary | ICD-10-CM

## 2016-06-11 LAB — POCT URINALYSIS DIPSTICK
GLUCOSE UA: NEGATIVE
NITRITE UA: NEGATIVE
Protein, UA: 30
Spec Grav, UA: >=1.03
UROBILINOGEN UA: 1
pH, UA: 5.5

## 2016-06-11 MED ORDER — SULFAMETHOXAZOLE-TRIMETHOPRIM 800-160 MG PO TABS: 1.0000 | ORAL_TABLET | Freq: Two times a day (BID) | ORAL | 0 refills | Status: DC

## 2016-06-11 NOTE — Progress Notes (Signed)
BP 116/80 (BP Location: Left Arm, Patient Position: Sitting, Cuff Size: Normal)   Pulse 86   Temp 97.7 F (36.5 C)   Ht 5' 4.75" (1.645 m)   Wt 194 lb 4 oz (88.1 kg)   SpO2 97%   BMI 32.57 kg/m    Subjective:    Patient ID: Kristy Christian, female    DOB: 08/11/1961, 55 y.o.   MRN: [id number]  HPI: Kristy Christian is a 55 y.o. female presenting on 06/11/2016 for Flank Pain (w burning when urinating.)   HPI Pt started with urine buring for 5-6 days.  She didn't come in sooner b/c she was out of town.  Pt has had multiple, repeated urine infections.  Pt has appointment with GI for hx barrett's esophagus and with [facility] for abnormal mammogram   Relevant past medical, surgical, family and social history reviewed and updated as indicated. Interim medical history since our last visit reviewed. Allergies and medications reviewed and updated.  Review of Systems  Constitutional: Positive for appetite change and fatigue. Negative for chills, diaphoresis, fever and unexpected weight change.  HENT: Negative for congestion, drooling, ear pain, facial swelling, hearing loss, mouth sores, sneezing, sore throat, trouble swallowing and voice change.   Eyes: Negative for pain, discharge, redness, itching and visual disturbance.  Respiratory: Negative for cough, choking, shortness of breath and wheezing.   Cardiovascular: Negative for chest pain, palpitations and leg swelling.  Gastrointestinal: Positive for abdominal pain. Negative for blood in stool, constipation, diarrhea and vomiting.  Endocrine: Negative for cold intolerance, heat intolerance and polydipsia.  Genitourinary: Positive for dysuria. Negative for decreased urine volume and hematuria.  Musculoskeletal: Negative for arthralgias, back pain and gait problem.  Skin: Negative for rash.  Allergic/Immunologic: Negative for environmental allergies.  Neurological: Negative for seizures, syncope, light-headedness and headaches.  Hematological:  Negative for adenopathy.  Psychiatric/Behavioral: Positive for dysphoric mood. Negative for agitation and suicidal ideas. The patient is not nervous/anxious.     Per HPI unless specifically indicated above     Objective:    BP 116/80 (BP Location: Left Arm, Patient Position: Sitting, Cuff Size: Normal)   Pulse 86   Temp 97.7 F (36.5 C)   Ht 5' 4.75" (1.645 m)   Wt 194 lb 4 oz (88.1 kg)   SpO2 97%   BMI 32.57 kg/m   Wt Readings from Last 3 Encounters:  06/11/16 194 lb 4 oz (88.1 kg)  04/15/16 194 lb (88 kg)  04/08/16 201 lb (91.2 kg)    Physical Exam  Constitutional: She is oriented to person, place, and time. She appears well-developed and well-nourished.  HENT:  Head: Normocephalic and atraumatic.  Neck: Neck supple.  Cardiovascular: Normal rate and regular rhythm.   Pulmonary/Chest: Effort normal and breath sounds normal.  Abdominal: Soft. Bowel sounds are normal. She exhibits no mass. There is no hepatosplenomegaly. There is tenderness (mild) in the suprapubic area. There is no rigidity, no rebound, no guarding and no CVA tenderness.  Musculoskeletal: She exhibits no edema.  Lymphadenopathy:    She has no cervical adenopathy.  Neurological: She is alert and oriented to person, place, and time.  Skin: Skin is warm and dry.  Psychiatric: She has a normal mood and affect. Her behavior is normal.  Vitals reviewed.   Results for orders placed or performed in visit on 06/11/16  POCT Urinalysis Dipstick  Result Value Ref Range   Color, UA DARK YELLOW    Clarity, UA CLOUDY    Glucose, UA NEG  Bilirubin, UA SMALL    Ketones, UA TRACE    Spec Grav, UA >=1.030    Blood, UA MODERATE    pH, UA 5.5    Protein, UA 30    Urobilinogen, UA 1.0    Nitrite, UA NEG    Leukocytes, UA large (3+) (A) Negative      Assessment & Plan:     Encounter Diagnoses  Name Primary?  . Acute recurrent cystitis Yes  . Flank pain   . Neurofibromatosis ([place])    -rx septra for  urinary infection -will refer to urology for evaluation in light of recurrent infection in setting of neurofibromatosis -f/u September as scheduled.  RTO sooner prn

## 2016-07-09 ENCOUNTER — Encounter: Payer: Self-pay | Admitting: Nurse Practitioner

## 2016-07-09 ENCOUNTER — Ambulatory Visit: Payer: Self-pay | Admitting: Nurse Practitioner

## 2016-07-09 ENCOUNTER — Telehealth: Payer: Self-pay | Admitting: Nurse Practitioner

## 2016-07-09 NOTE — Telephone Encounter (Signed)
Letter mailed

## 2016-07-09 NOTE — Telephone Encounter (Signed)
Pt was a no show

## 2016-08-11 ENCOUNTER — Ambulatory Visit: Payer: Self-pay | Admitting: Physician Assistant

## 2016-08-13 ENCOUNTER — Encounter: Payer: Self-pay | Admitting: Physician Assistant

## 2016-11-11 ENCOUNTER — Ambulatory Visit: Payer: Self-pay | Admitting: Physician Assistant

## 2016-11-11 ENCOUNTER — Encounter: Payer: Self-pay | Admitting: Physician Assistant

## 2016-11-11 VITALS — BP 120/80 | HR 92 | Temp 97.9°F | Ht 64.75 in | Wt 200.5 lb

## 2016-11-11 DIAGNOSIS — F32A Depression, unspecified: Secondary | ICD-10-CM

## 2016-11-11 DIAGNOSIS — K589 Irritable bowel syndrome without diarrhea: Secondary | ICD-10-CM

## 2016-11-11 DIAGNOSIS — Q8500 Neurofibromatosis, unspecified: Secondary | ICD-10-CM

## 2016-11-11 DIAGNOSIS — E669 Obesity, unspecified: Secondary | ICD-10-CM

## 2016-11-11 DIAGNOSIS — F17210 Nicotine dependence, cigarettes, uncomplicated: Secondary | ICD-10-CM

## 2016-11-11 DIAGNOSIS — Z6833 Body mass index (BMI) 33.0-33.9, adult: Secondary | ICD-10-CM

## 2016-11-11 DIAGNOSIS — K219 Gastro-esophageal reflux disease without esophagitis: Secondary | ICD-10-CM

## 2016-11-11 DIAGNOSIS — F329 Major depressive disorder, single episode, unspecified: Secondary | ICD-10-CM

## 2016-11-11 NOTE — Congregational Nurse Program (Signed)
Congregational Nurse Program Note  Date of Encounter: 11/11/2016  Past Medical History: Past Medical History:  Diagnosis Date  . GERD (gastroesophageal reflux disease)   . IBS (irritable bowel syndrome)   . Knee pain   . Neurofibromatosis ([place])   . UTI (lower urinary tract infection)     Encounter Details:     CNP Questionnaire - 11/11/16 1400      Patient Demographics   Is this a new or existing patient? Existing   Patient is considered a/an Not Applicable   Race Caucasian/White     Patient Assistance   Location of Patient [place]   Patient's financial/insurance status Low Income;Self-Pay (Uninsured)   Uninsured Patient ([place]) Yes   Interventions Follow-up/Education/Support provided after completed appt.   Patient referred to apply for the following financial assistance [place] insecurities addressed Not Applicable   Transportation assistance No   Assistance securing medications No   Educational health offerings Behavioral health;Chronic disease;Navigating the healthcare system     Encounter Details   Primary purpose of visit Chronic Illness/Condition Visit;Post PCP Visit;Navigating the Healthcare System;Other   Was an Emergency Department visit averted? Not Applicable   Does patient have a medical provider? No   Patient referred to [facility];Other   Was a mental health screening completed? (GAINS tool) No   Does patient have dental issues? No   Does patient have vision issues? No   Does your patient have an abnormal blood pressure today? No   Since previous encounter, have you referred patient for abnormal blood pressure that resulted in a new diagnosis or medication change? No   Does your patient have an abnormal blood glucose today? No   Since previous encounter, have you referred patient for abnormal blood glucose that resulted in a new diagnosis or medication change? No   Was there a life-saving  intervention made? No     Client came by this afternoon after having hers appointment this morning at the [facility]. The clinic set up this appointment for her as she shared that she is now homeless and has been laid off from her job and no longer has medical insurance. Client states that she is currently living with a friend off and on. RN and client discussed plan : 1). Complete online application for [employer] for housing. 2).[facility]. If cannot make appointments, call 24 hours in advance. 3). Reconnect with [facility] for mental health. RN called [facility] with client to get the date and time for decisions class. Next Class is Friday December 29 th and client to arrive at 12:45 for registration. Client needs to do the "decisions" class to return to groups such as anger management. RN suggested client seek connecting with a therapist at [facility] also for counseling in regards to alcohol use and depression.  4). [employer] contact number for Crisis and access to care given to client. 5). Client to continue checking with [employer] for possible job openings as well as resume' building. 6). Information given to client for [facility] homeless [facility] for shelter option. RN contact information given client if she wishes to connect to the homeless shelter. 7). RN encouraged client to connect with Adline Potter financial resource specialist at [facility] to complete her [facility] discount application for any future testing that she may require by her medical provider. (client mentioned she would like Cancer screening for liver cancer. Client informed that [facility] cannot do those screenings and that should be a discussion for her primary medical provider.).  Client states she has already connected with unemployment office to inquire about possible unemployment owed her. She states she has also started a disability application.  Plan written  down and copy given to client along with all contact number for agencies. Will follow as needed and client to contact RN for any further questions.

## 2016-11-11 NOTE — Progress Notes (Signed)
BP 120/80 (BP Location: Left Arm, Patient Position: Sitting, Cuff Size: Normal)   Pulse 92   Temp 97.9 F (36.6 C)   Ht 5' 4.75" (1.645 m)   Wt 200 lb 8 oz (90.9 kg)   SpO2 99%   BMI 33.62 kg/m    Subjective:    Patient ID: Kristy Christian, female    DOB: 1961/06/30, 55 y.o.   MRN: [id number]  HPI: Kristy Christian is a 55 y.o. female presenting on 11/11/2016 for Follow-up   HPI   Pt says she is "basically homeless and jobless".  She says that is why she didn't make it to any of her appointments recently.    She says she has been drinking some lately but not like she used to.   She spoke on phone with congregational nursing but then she lost her phone so she couldn't speak with them again to schedule appt.  She says she has transprotation  She is no longer having abd pain.   Says she would like to get back to taking her omeprazole regularly.  Says not using it daily now b/c she has no address to send her meds to.   Pt is worried about cancer and wants to be screened.  Discussed with her that we have scheduled multiple mutliple appts for her mammogram that she cancels or no-shows.    Discussed that she had colonoscopy in 2009 that she says was normal and had no polyps so that not due again until 2019.  Discussed that she had hysterectomy so PAP not indicated.   Discussed that smoking is a big cancer risk and encouraged her to stop.    Relevant past medical, surgical, family and social history reviewed and updated as indicated. Interim medical history since our last visit reviewed. Allergies and medications reviewed and updated.   Current Outpatient Prescriptions:  .  omeprazole (PRILOSEC) 40 MG capsule, Take 1 capsule (40 mg total) by mouth daily., Disp: 90 capsule, Rfl: 1 .  Cyanocobalamin (VITAMIN B 12 PO), Take 2 capsules by mouth daily., Disp: , Rfl:  .  Ginkgo Biloba (GINKOBA) 40 MG TABS, Take 1 tablet by mouth daily., Disp: , Rfl:  .  Omega-3 Fatty Acids (FISH OIL OMEGA-3) 1000  MG CAPS, Take 1 capsule by mouth 2 (two) times daily. , Disp: , Rfl:    Review of Systems  Constitutional: Positive for appetite change and fatigue. Negative for chills, diaphoresis, fever and unexpected weight change.  HENT: Negative for congestion, dental problem, drooling, ear pain, facial swelling, hearing loss, mouth sores, sneezing, sore throat, trouble swallowing and voice change.   Eyes: Negative for pain, discharge, redness, itching and visual disturbance.  Respiratory: Negative for cough, choking, shortness of breath and wheezing.   Cardiovascular: Negative for chest pain, palpitations and leg swelling.  Gastrointestinal: Negative for abdominal pain, blood in stool, constipation, diarrhea and vomiting.  Endocrine: Negative for cold intolerance, heat intolerance and polydipsia.  Genitourinary: Negative for decreased urine volume, dysuria and hematuria.  Musculoskeletal: Negative for arthralgias, back pain and gait problem.  Skin: Negative for rash.  Allergic/Immunologic: Negative for environmental allergies.  Neurological: Negative for seizures, syncope, light-headedness and headaches.  Hematological: Negative for adenopathy.  Psychiatric/Behavioral: Positive for dysphoric mood. Negative for agitation and suicidal ideas. The patient is nervous/anxious.     Per HPI unless specifically indicated above     Objective:    BP 120/80 (BP Location: Left Arm, Patient Position: Sitting, Cuff Size: Normal)   Pulse 92  Temp 97.9 F (36.6 C)   Ht 5' 4.75" (1.645 m)   Wt 200 lb 8 oz (90.9 kg)   SpO2 99%   BMI 33.62 kg/m   Wt Readings from Last 3 Encounters:  11/11/16 200 lb 8 oz (90.9 kg)  06/11/16 194 lb 4 oz (88.1 kg)  04/15/16 194 lb (88 kg)    Physical Exam  Constitutional: She is oriented to person, place, and time. She appears well-developed and well-nourished.  HENT:  Head: Normocephalic and atraumatic.  Neck: Neck supple.  Cardiovascular: Normal rate and regular  rhythm.   Pulmonary/Chest: Effort normal and breath sounds normal.  Abdominal: Soft. Bowel sounds are normal. She exhibits no mass. There is no hepatosplenomegaly. There is no tenderness.  Musculoskeletal: She exhibits no edema.  Lymphadenopathy:    She has no cervical adenopathy.  Neurological: She is alert and oriented to person, place, and time.  Skin: Skin is warm and dry.  Psychiatric: She has a normal mood and affect. Her behavior is normal.  Vitals reviewed.       Assessment & Plan:   Encounter Diagnoses  Name Primary?  . Gastroesophageal reflux disease, esophagitis presence not specified Yes  . Irritable bowel syndrome, unspecified type   . Depression, unspecified depression type   . Cigarette nicotine dependence without complication   . Neurofibromatosis ([place])   . Class 1 obesity with body mass index (BMI) of 33.0 to 33.9 in adult, unspecified obesity type, unspecified whether serious comorbidity present       -will check on referral to [facility] for abnormal mammogram 03/13/16.   Discussed with pt again to the importance of showing up for her appointments -urged pt to return [facility] for counseling -Gave pt list of local AA meetings -made pt appointment to see Paia Will see about getting pt her omeprazole from [facility] -Discussed why she needs to get to her appointments -follow up here in 1 month.   RTO sooner prn

## 2016-11-25 ENCOUNTER — Encounter: Payer: Self-pay | Admitting: Physician Assistant

## 2016-11-25 ENCOUNTER — Ambulatory Visit: Payer: Self-pay | Admitting: Physician Assistant

## 2016-11-25 VITALS — BP 108/74 | HR 93 | Temp 97.7°F | Ht 64.75 in | Wt 197.8 lb

## 2016-11-25 DIAGNOSIS — N3090 Cystitis, unspecified without hematuria: Secondary | ICD-10-CM

## 2016-11-25 DIAGNOSIS — F1010 Alcohol abuse, uncomplicated: Secondary | ICD-10-CM

## 2016-11-25 DIAGNOSIS — F32A Depression, unspecified: Secondary | ICD-10-CM

## 2016-11-25 DIAGNOSIS — R8290 Unspecified abnormal findings in urine: Secondary | ICD-10-CM

## 2016-11-25 DIAGNOSIS — F329 Major depressive disorder, single episode, unspecified: Secondary | ICD-10-CM

## 2016-11-25 LAB — POCT URINALYSIS DIPSTICK
BILIRUBIN UA: NEGATIVE
Glucose, UA: NEGATIVE
Ketones, UA: NEGATIVE
Nitrite, UA: POSITIVE
Protein, UA: 30
SPEC GRAV UA: 1.02
UROBILINOGEN UA: 0.2
pH, UA: 5

## 2016-11-25 MED ORDER — OMEPRAZOLE 40 MG PO CPDR: 40.0000 mg | DELAYED_RELEASE_CAPSULE | Freq: Every day | ORAL | 1 refills | Status: DC

## 2016-11-25 MED ORDER — SULFAMETHOXAZOLE-TRIMETHOPRIM 800-160 MG PO TABS: 1.0000 | ORAL_TABLET | Freq: Two times a day (BID) | ORAL | 0 refills | Status: AC

## 2016-11-25 NOTE — Progress Notes (Signed)
BP 108/74 (BP Location: Left Arm, Patient Position: Sitting, Cuff Size: Normal)   Pulse 93   Temp 97.7 F (36.5 C)   Ht 5' 4.75" (1.645 m)   Wt 197 lb 12 oz (89.7 kg)   SpO2 99%   BMI 33.16 kg/m    Subjective:    Patient ID: Kristy Christian, female    DOB: [birth year], 56 y.o.   MRN: [id number]  HPI: Kristy Christian is a 56 y.o. female presenting on 11/25/2016 for Dysuria (pt states urine is dark and smelly for last 4 days.)   HPI   Chief Complaint  Patient presents with  . Dysuria    pt states urine is dark and smelly for last 4 days.    Pt states no pain, just dark and smelly  Pt met with Mardene Celeste of congregational nursing earlier today  Pt went back to [facility]  But she wants to go somewhere else b/c the doctor is "not nice".   Pt is still drinking some.  She has gone to some AA meetings but she says the meetings "aren't the same as they used to be back home".  Relevant past medical, surgical, family and social history reviewed and updated as indicated. Interim medical history since our last visit reviewed. Allergies and medications reviewed and updated.   CURRENT MEDS: None  Review of Systems  Constitutional: Positive for appetite change, fatigue and unexpected weight change. Negative for chills, diaphoresis and fever.  HENT: Positive for congestion. Negative for dental problem, drooling, ear pain, facial swelling, hearing loss, mouth sores, sneezing, sore throat, trouble swallowing and voice change.   Eyes: Negative for pain, discharge, redness, itching and visual disturbance.  Respiratory: Negative for cough, choking, shortness of breath and wheezing.   Cardiovascular: Negative for chest pain, palpitations and leg swelling.  Gastrointestinal: Negative for abdominal pain, blood in stool, constipation, diarrhea and vomiting.  Endocrine: Negative for cold intolerance, heat intolerance and polydipsia.  Genitourinary: Negative for decreased urine volume, dysuria and  hematuria.  Musculoskeletal: Negative for arthralgias, back pain and gait problem.  Skin: Negative for rash.  Allergic/Immunologic: Negative for environmental allergies.  Neurological: Positive for headaches. Negative for seizures, syncope and light-headedness.  Hematological: Negative for adenopathy.  Psychiatric/Behavioral: Positive for dysphoric mood. Negative for agitation and suicidal ideas. The patient is not nervous/anxious.     Per HPI unless specifically indicated above     Objective:    BP 108/74 (BP Location: Left Arm, Patient Position: Sitting, Cuff Size: Normal)   Pulse 93   Temp 97.7 F (36.5 C)   Ht 5' 4.75" (1.645 m)   Wt 197 lb 12 oz (89.7 kg)   SpO2 99%   BMI 33.16 kg/m   Wt Readings from Last 3 Encounters:  11/25/16 197 lb 12 oz (89.7 kg)  11/11/16 200 lb 8 oz (90.9 kg)  06/11/16 194 lb 4 oz (88.1 kg)    Physical Exam  Constitutional: She is oriented to person, place, and time. She appears well-developed and well-nourished.  HENT:  Head: Normocephalic and atraumatic.  Neck: Neck supple.  Cardiovascular: Normal rate and regular rhythm.   Pulmonary/Chest: Effort normal and breath sounds normal.  Abdominal: Soft. Bowel sounds are normal. She exhibits no mass. There is no hepatosplenomegaly. There is no tenderness.  Musculoskeletal: She exhibits no edema.  Lymphadenopathy:    She has no cervical adenopathy.  Neurological: She is alert and oriented to person, place, and time.  Skin: Skin is warm and dry.  Psychiatric: She  has a normal mood and affect. Her behavior is normal.  Vitals reviewed.       Assessment & Plan:    Encounter Diagnoses  Name Primary?  . Cystitis Yes  . Urine malodor   . Depression, unspecified depression type   . Alcohol abuse      -Gave [facility] card to get [place] referral -counseled pt to continue trying to find [place] meeting where she feels comfortable -rx bactrim (she says it works well) -follow up later this month as  scheduled.  RTO sooner prn  (The duration of this appointment visit was 25 minutes of face-to-face time with the patient.  Greater than 50% of this time was spent in counseling, explanation of diagnosis, planning of further management, and coordination of care.)

## 2016-12-01 ENCOUNTER — Encounter: Payer: Self-pay | Admitting: Physician Assistant

## 2016-12-01 NOTE — Progress Notes (Signed)
Pt has [facility] appointment scheduled for 12-24-16 @ 1 pm with marnie. Pt will be notified by [facility] when she comes to her f/u appointment 12-14-16.

## 2016-12-14 ENCOUNTER — Encounter: Payer: Self-pay | Admitting: Physician Assistant

## 2016-12-14 ENCOUNTER — Ambulatory Visit: Payer: Self-pay | Admitting: Physician Assistant

## 2016-12-14 VITALS — BP 114/78 | HR 86 | Temp 97.7°F | Ht 64.75 in | Wt 193.0 lb

## 2016-12-14 DIAGNOSIS — F329 Major depressive disorder, single episode, unspecified: Secondary | ICD-10-CM

## 2016-12-14 DIAGNOSIS — F32A Depression, unspecified: Secondary | ICD-10-CM

## 2016-12-14 DIAGNOSIS — H579 Unspecified disorder of eye and adnexa: Secondary | ICD-10-CM

## 2016-12-14 DIAGNOSIS — Q8500 Neurofibromatosis, unspecified: Secondary | ICD-10-CM

## 2016-12-14 DIAGNOSIS — K219 Gastro-esophageal reflux disease without esophagitis: Secondary | ICD-10-CM

## 2016-12-14 NOTE — Patient Instructions (Addendum)
[place] [facility] [place] [phone] Thurs. Dec 24, 2016 1 PM

## 2016-12-14 NOTE — Progress Notes (Signed)
BP 114/78 (BP Location: Left Arm, Patient Position: Sitting, Cuff Size: Normal)   Pulse 86   Temp 97.7 F (36.5 C) (Other (Comment))   Ht 5' 4.75" (1.645 m)   Wt 193 lb (87.5 kg)   SpO2 99%   BMI 32.37 kg/m    Subjective:    Patient ID: Kristy Christian, female    DOB: 02-19-1961, 56 y.o.   MRN: [id number]  HPI: Kristy Christian is a 56 y.o. female presenting on 12/14/2016 for Follow-up   HPI    [facility] but says they are supposed to call her back She isn't going to [place] meetings much but says she hasn't been drinking much.  She has been focusing on trying to get a job.    Pt complains of some eye problems and worries that it is due to her neurofibromatosis  Relevant past medical, surgical, family and social history reviewed and updated as indicated. Interim medical history since our last visit reviewed. Allergies and medications reviewed and updated.  CURRENT MEDS: none  Review of Systems  Constitutional: Negative for appetite change, chills, diaphoresis, fatigue, fever and unexpected weight change.  HENT: Negative for congestion, dental problem, drooling, ear pain, facial swelling, hearing loss, mouth sores, sneezing, sore throat, trouble swallowing and voice change.   Eyes: Positive for pain and visual disturbance. Negative for discharge, redness and itching.  Respiratory: Negative for cough, choking, shortness of breath and wheezing.   Cardiovascular: Negative for chest pain, palpitations and leg swelling.  Gastrointestinal: Negative for abdominal pain, blood in stool, constipation, diarrhea and vomiting.  Endocrine: Negative for cold intolerance, heat intolerance and polydipsia.  Genitourinary: Negative for decreased urine volume, dysuria and hematuria.  Musculoskeletal: Negative for arthralgias, back pain and gait problem.  Skin: Negative for rash.  Allergic/Immunologic: Negative for environmental allergies.  Neurological: Negative for seizures, syncope,  light-headedness and headaches.  Hematological: Negative for adenopathy.  Psychiatric/Behavioral: Negative for agitation, dysphoric mood and suicidal ideas. The patient is not nervous/anxious.     Per HPI unless specifically indicated above     Objective:    BP 114/78 (BP Location: Left Arm, Patient Position: Sitting, Cuff Size: Normal)   Pulse 86   Temp 97.7 F (36.5 C) (Other (Comment))   Ht 5' 4.75" (1.645 m)   Wt 193 lb (87.5 kg)   SpO2 99%   BMI 32.37 kg/m   Wt Readings from Last 3 Encounters:  12/14/16 193 lb (87.5 kg)  11/25/16 197 lb 12 oz (89.7 kg)  11/11/16 200 lb 8 oz (90.9 kg)    Physical Exam  Constitutional: She is oriented to person, place, and time. She appears well-developed and well-nourished.  HENT:  Head: Normocephalic and atraumatic.  Neck: Neck supple.  Cardiovascular: Normal rate and regular rhythm.   Pulmonary/Chest: Effort normal and breath sounds normal.  Abdominal: Soft. Bowel sounds are normal. She exhibits no mass. There is no hepatosplenomegaly. There is no tenderness.  Musculoskeletal: She exhibits no edema.  Lymphadenopathy:    She has no cervical adenopathy.  Neurological: She is alert and oriented to person, place, and time.  Skin: Skin is warm and dry.  Psychiatric: She has a normal mood and affect. Her behavior is normal.  Vitals reviewed.       Assessment & Plan:   Encounter Diagnoses  Name Primary?  . Neurofibromatosis ([place]) Yes  . Bilateral eye complaint   . Gastroesophageal reflux disease, esophagitis presence not specified   . Depression, unspecified depression type      -  [facility] appointment rescheduled (she no-showed to appt that we made for her previously)- pt given information -Pt given omeprazole from [facility] today -will Check eye info- nurse will call- will try to refer pt for evaluation by ophthalmologist for eye problems related to her neurofibromatosis -urged pt to contact [facility] for [facility] appointment -F/u 3  months.  RTO sooner prn

## 2017-01-14 ENCOUNTER — Other Ambulatory Visit (HOSPITAL_COMMUNITY): Payer: Self-pay | Admitting: *Deleted

## 2017-01-14 DIAGNOSIS — R921 Mammographic calcification found on diagnostic imaging of breast: Secondary | ICD-10-CM

## 2017-02-02 ENCOUNTER — Encounter: Payer: Self-pay | Admitting: Physician Assistant

## 2017-02-02 ENCOUNTER — Ambulatory Visit: Payer: Self-pay | Admitting: Physician Assistant

## 2017-02-02 VITALS — BP 130/84 | HR 98 | Temp 97.9°F | Ht 64.75 in | Wt 198.5 lb

## 2017-02-02 DIAGNOSIS — R300 Dysuria: Secondary | ICD-10-CM

## 2017-02-02 DIAGNOSIS — N3090 Cystitis, unspecified without hematuria: Secondary | ICD-10-CM

## 2017-02-02 LAB — POCT URINALYSIS DIPSTICK
GLUCOSE UA: NEGATIVE
Nitrite, UA: NEGATIVE
Protein, UA: 30
Spec Grav, UA: 1.03 (ref 1.030–1.035)
Urobilinogen, UA: 1 (ref ?–2.0)
pH, UA: 5 (ref 5.0–8.0)

## 2017-02-02 MED ORDER — SULFAMETHOXAZOLE-TRIMETHOPRIM 800-160 MG PO TABS: 1.0000 | ORAL_TABLET | Freq: Two times a day (BID) | ORAL | 0 refills | Status: AC

## 2017-02-02 NOTE — Progress Notes (Signed)
BP 130/84 (BP Location: Right Arm, Patient Position: Sitting, Cuff Size: Normal)   Pulse 98   Temp 97.9 F (36.6 C) (Other (Comment))   Ht 5' 4.75" (1.645 m)   Wt 198 lb 8 oz (90 kg)   SpO2 98%   BMI 33.29 kg/m    Subjective:    Patient ID: Kristy Christian, female    DOB: 1961-11-10, 56 y.o.   MRN: [id number]  HPI: Kristy Christian is a 56 y.o. female presenting on 02/02/2017 for Follow-up (letter, c/o dark urine with foul odor, requests urinalysis)   HPI   Pt here today to get a letter but says she would rather be seen for her urine  She states c/o uncomfortable feeling with urine 4 or 5 days ago.    Starts working PT at [employer] tomorrow  Pt still waiting for appointment for [place] (because she wants to go somewhere not [facility])  Relevant past medical, surgical, family and social history reviewed and updated as indicated. Interim medical history since our last visit reviewed. Allergies and medications reviewed and updated.   Current Outpatient Prescriptions:  .  omeprazole (PRILOSEC) 40 MG capsule, Take 1 capsule (40 mg total) by mouth daily., Disp: 90 capsule, Rfl: 1 .  Cyanocobalamin (VITAMIN B 12 PO), Take 2 capsules by mouth daily., Disp: , Rfl:  .  Ginkgo Biloba (GINKOBA) 40 MG TABS, Take 1 tablet by mouth daily., Disp: , Rfl:  .  Omega-3 Fatty Acids (FISH OIL OMEGA-3) 1000 MG CAPS, Take 1 capsule by mouth 2 (two) times daily. , Disp: , Rfl:    Review of Systems  Constitutional: Positive for fatigue. Negative for appetite change, chills, diaphoresis, fever and unexpected weight change.  HENT: Positive for sneezing. Negative for congestion, dental problem, drooling, ear pain, facial swelling, hearing loss, mouth sores, sore throat, trouble swallowing and voice change.   Eyes: Negative for pain, discharge, redness, itching and visual disturbance.  Respiratory: Negative for cough, choking, shortness of breath and wheezing.   Cardiovascular: Positive for leg swelling. Negative  for chest pain and palpitations.  Gastrointestinal: Positive for constipation. Negative for abdominal pain, blood in stool, diarrhea and vomiting.  Endocrine: Negative for cold intolerance, heat intolerance and polydipsia.  Genitourinary: Negative for decreased urine volume, dysuria and hematuria.  Musculoskeletal: Positive for arthralgias. Negative for back pain and gait problem.  Skin: Negative for rash.  Allergic/Immunologic: Negative for environmental allergies.  Neurological: Negative for seizures, syncope, light-headedness and headaches.  Hematological: Negative for adenopathy.  Psychiatric/Behavioral: Positive for dysphoric mood. Negative for agitation and suicidal ideas. The patient is not nervous/anxious.     Per HPI unless specifically indicated above     Objective:    BP 130/84 (BP Location: Right Arm, Patient Position: Sitting, Cuff Size: Normal)   Pulse 98   Temp 97.9 F (36.6 C) (Other (Comment))   Ht 5' 4.75" (1.645 m)   Wt 198 lb 8 oz (90 kg)   SpO2 98%   BMI 33.29 kg/m   Wt Readings from Last 3 Encounters:  02/02/17 198 lb 8 oz (90 kg)  12/14/16 193 lb (87.5 kg)  11/25/16 197 lb 12 oz (89.7 kg)    Physical Exam  Constitutional: She is oriented to person, place, and time. She appears well-developed and well-nourished.  HENT:  Head: Normocephalic and atraumatic.  Neck: Neck supple.  Cardiovascular: Normal rate and regular rhythm.   Pulmonary/Chest: Effort normal and breath sounds normal.  Abdominal: Soft. Bowel sounds are normal. She exhibits  no distension and no mass. There is no hepatosplenomegaly. There is no tenderness. There is no rigidity, no rebound, no guarding and no CVA tenderness.  Musculoskeletal: She exhibits no edema.  Lymphadenopathy:    She has no cervical adenopathy.  Neurological: She is alert and oriented to person, place, and time.  Skin: Skin is warm and dry.  Psychiatric: She has a normal mood and affect. Her behavior is normal.   Vitals reviewed.       Assessment & Plan:    Encounter Diagnoses  Name Primary?  . Cystitis Yes  . Dysuria      -rx septra ds for urine -counseled pt and gave handout on UTI -follow up as scheudled

## 2017-02-02 NOTE — Patient Instructions (Signed)

## 2017-02-16 ENCOUNTER — Encounter (HOSPITAL_COMMUNITY): Payer: Self-pay

## 2017-02-23 ENCOUNTER — Encounter (HOSPITAL_COMMUNITY): Payer: Self-pay

## 2017-03-08 ENCOUNTER — Ambulatory Visit: Payer: Self-pay | Admitting: Physician Assistant

## 2017-03-08 ENCOUNTER — Encounter: Payer: Self-pay | Admitting: Physician Assistant

## 2017-03-08 VITALS — BP 120/82 | HR 95 | Temp 97.7°F | Wt 192.0 lb

## 2017-03-08 DIAGNOSIS — K529 Noninfective gastroenteritis and colitis, unspecified: Secondary | ICD-10-CM

## 2017-03-08 DIAGNOSIS — R197 Diarrhea, unspecified: Secondary | ICD-10-CM

## 2017-03-08 DIAGNOSIS — R112 Nausea with vomiting, unspecified: Secondary | ICD-10-CM

## 2017-03-08 DIAGNOSIS — R1084 Generalized abdominal pain: Secondary | ICD-10-CM

## 2017-03-08 LAB — POCT URINALYSIS DIPSTICK
GLUCOSE UA: NEGATIVE
NITRITE UA: NEGATIVE
Protein, UA: 30
Spec Grav, UA: 1.02 (ref 1.010–1.025)
Urobilinogen, UA: 1 E.U./dL
pH, UA: 5.5 (ref 5.0–8.0)

## 2017-03-08 MED ORDER — PROMETHAZINE HCL 25 MG PO TABS: ORAL_TABLET | ORAL | 0 refills | Status: DC

## 2017-03-08 MED ORDER — PROMETHAZINE HCL 25 MG RE SUPP: 25.0000 mg | Freq: Four times a day (QID) | RECTAL | 0 refills | Status: DC | PRN

## 2017-03-08 NOTE — Progress Notes (Signed)
BP 120/82 (BP Location: Left Arm, Patient Position: Sitting, Cuff Size: Large)   Pulse 95   Temp 97.7 F (36.5 C)   Wt 192 lb (87.1 kg)   SpO2 99%   BMI 32.20 kg/m    Subjective:    Patient ID: Kristy Christian, female    DOB: 03/06/1961, 56 y.o.   MRN: [id number]  HPI: Kristy Christian is a 56 y.o. female presenting on 03/08/2017 for GI Problem   HPI   Pt states no burning with urination.  States abd pain, vomiting, diarhea.  Started last Thursday.  She is now able to sip a little bit of water.  Not eating yet.  No blood in diarrhea.   She is working part-time at [employer] in [place].  She got out of the homeless shelter   Today only had one diarrhea and no emesis.  She is currently sipping water   Relevant past medical, surgical, family and social history reviewed and updated as indicated. Interim medical history since our last visit reviewed. Allergies and medications reviewed and updated.   Current Outpatient Prescriptions:  .  omeprazole (PRILOSEC) 40 MG capsule, Take 1 capsule (40 mg total) by mouth daily., Disp: 90 capsule, Rfl: 1 .  Cyanocobalamin (VITAMIN B 12 PO), Take 2 capsules by mouth daily., Disp: , Rfl:  .  Ginkgo Biloba (GINKOBA) 40 MG TABS, Take 1 tablet by mouth daily., Disp: , Rfl:  .  Omega-3 Fatty Acids (FISH OIL OMEGA-3) 1000 MG CAPS, Take 1 capsule by mouth 2 (two) times daily. , Disp: , Rfl:    Review of Systems  Constitutional: Positive for appetite change and fatigue. Negative for chills, diaphoresis, fever and unexpected weight change.  HENT: Negative for congestion, dental problem, drooling, ear pain, facial swelling, hearing loss, mouth sores, sneezing, sore throat, trouble swallowing and voice change.   Eyes: Negative for pain, discharge, redness, itching and visual disturbance.  Respiratory: Negative for cough, choking, shortness of breath and wheezing.   Cardiovascular: Negative for chest pain, palpitations and leg swelling.  Gastrointestinal:  Positive for abdominal pain, diarrhea and vomiting. Negative for blood in stool and constipation.  Endocrine: Negative for cold intolerance, heat intolerance and polydipsia.  Genitourinary: Negative for decreased urine volume, dysuria and hematuria.  Musculoskeletal: Negative for arthralgias, back pain and gait problem.  Skin: Negative for rash.  Allergic/Immunologic: Negative for environmental allergies.  Neurological: Negative for seizures, syncope, light-headedness and headaches.  Hematological: Negative for adenopathy.  Psychiatric/Behavioral: Negative for agitation, dysphoric mood and suicidal ideas. The patient is not nervous/anxious.     Per HPI unless specifically indicated above     Objective:    BP 120/82 (BP Location: Left Arm, Patient Position: Sitting, Cuff Size: Large)   Pulse 95   Temp 97.7 F (36.5 C)   Wt 192 lb (87.1 kg)   SpO2 99%   BMI 32.20 kg/m   Wt Readings from Last 3 Encounters:  03/08/17 192 lb (87.1 kg)  02/02/17 198 lb 8 oz (90 kg)  12/14/16 193 lb (87.5 kg)    Physical Exam  Constitutional: She is oriented to person, place, and time. She appears well-developed and well-nourished.  HENT:  Head: Normocephalic and atraumatic.  Neck: Neck supple.  Cardiovascular: Normal rate and regular rhythm.   Pulmonary/Chest: Effort normal and breath sounds normal.  Abdominal: Soft. Bowel sounds are normal. She exhibits no mass. There is no hepatosplenomegaly. There is no tenderness.  Musculoskeletal: She exhibits no edema.  Lymphadenopathy:  She has no cervical adenopathy.  Neurological: She is alert and oriented to person, place, and time.  Skin: Skin is warm and dry.  Psychiatric: She has a normal mood and affect. Her behavior is normal.  Vitals reviewed.        Assessment & Plan:   Encounter Diagnoses  Name Primary?  . Generalized abdominal pain Yes  . Gastroenteritis   . Non-intractable vomiting with nausea, unspecified vomiting type   .  Diarrhea, unspecified type      -pt given phenergan and counseled to avoid driving on this medication due to sedation.  She is counseled to increase fluids gently.  Can gradually advance diet as tolerated starting with bland foods like toast and crackers -pt has already been referred to to eye dr [facility]) -pt to follow up as scheduled.  She is to RTO in 48 hours if not well or sooner for worsening or new symptoms

## 2017-03-08 NOTE — Patient Instructions (Signed)

## 2017-03-09 ENCOUNTER — Ambulatory Visit (HOSPITAL_COMMUNITY)
Admission: RE | Admit: 2017-03-09 | Discharge: 2017-03-09 | Disposition: A | Discharge status: Home / Self Care | Payer: PRIVATE HEALTH INSURANCE | Source: Ambulatory Visit | Attending: *Deleted | Admitting: *Deleted

## 2017-03-09 DIAGNOSIS — R928 Other abnormal and inconclusive findings on diagnostic imaging of breast: Secondary | ICD-10-CM | POA: Diagnosis present

## 2017-03-09 DIAGNOSIS — R921 Mammographic calcification found on diagnostic imaging of breast: Secondary | ICD-10-CM | POA: Diagnosis present

## 2017-03-15 ENCOUNTER — Ambulatory Visit: Payer: Self-pay | Admitting: Physician Assistant

## 2017-03-17 ENCOUNTER — Ambulatory Visit: Payer: Self-pay | Admitting: Physician Assistant

## 2017-03-17 ENCOUNTER — Encounter: Payer: Self-pay | Admitting: Physician Assistant

## 2017-03-17 VITALS — BP 136/64 | HR 89 | Temp 98.6°F | Ht 64.75 in | Wt 202.5 lb

## 2017-03-17 DIAGNOSIS — K219 Gastro-esophageal reflux disease without esophagitis: Secondary | ICD-10-CM

## 2017-03-17 DIAGNOSIS — Q8500 Neurofibromatosis, unspecified: Secondary | ICD-10-CM

## 2017-03-17 NOTE — Progress Notes (Signed)
BP 136/64 (BP Location: Left Arm, Patient Position: Sitting, Cuff Size: Large)   Pulse 89   Temp 98.6 F (37 C)   Ht 5' 4.75" (1.645 m)   Wt 202 lb 8 oz (91.9 kg)   SpO2 99%   BMI 33.96 kg/m    Subjective:    Patient ID: Kristy Christian, female    DOB: 23-May-1961, 56 y.o.   MRN: [id number]  HPI: Kristy Christian is a 56 y.o. female presenting on 03/17/2017 for Follow-up   HPI  Pt is doing well today.  Her Vomiting resolved  Pt is still Working part-time at [employer].  She is hoping to get full time job soon.   She just applied today.  Pt states Urine okay.  No dysuria today  Asked about her Eye referral- she wonders if anything closer than [facility].  Told her that no eye doctors accept the [facility] discount.  She was given phone number for financial counselor at [facility]  Asked her about [place] speicalist-  She has Not seen yet. She says she Wants to get hours straight at work and then schedule.  She is given another card for [facility]  Pt says she is Not drinking- she says she can't afford etoh  Asked her about her gerd.  She says the omeprazole helps a lot  Pt got her mammogram which was good   Relevant past medical, surgical, family and social history reviewed and updated as indicated. Interim medical history since our last visit reviewed. Allergies and medications reviewed and updated.   Current Outpatient Prescriptions:  .  omeprazole (PRILOSEC) 40 MG capsule, Take 1 capsule (40 mg total) by mouth daily. (Patient taking differently: Take 40 mg by mouth 2 (two) times daily. ), Disp: 90 capsule, Rfl: 1 .  Cyanocobalamin (VITAMIN B 12 PO), Take 2 capsules by mouth daily., Disp: , Rfl:  .  Ginkgo Biloba (GINKOBA) 40 MG TABS, Take 1 tablet by mouth daily., Disp: , Rfl:  .  Omega-3 Fatty Acids (FISH OIL OMEGA-3) 1000 MG CAPS, Take 1 capsule by mouth 2 (two) times daily. , Disp: , Rfl:    Review of Systems  Constitutional: Positive for appetite change and fatigue. Negative for chills,  diaphoresis, fever and unexpected weight change.  HENT: Negative for congestion, drooling, ear pain, facial swelling, hearing loss, mouth sores, sneezing, sore throat, trouble swallowing and voice change.   Eyes: Negative for pain, discharge, redness, itching and visual disturbance.  Respiratory: Negative for cough, choking, shortness of breath and wheezing.   Cardiovascular: Positive for leg swelling. Negative for chest pain and palpitations.  Gastrointestinal: Positive for constipation and diarrhea. Negative for abdominal pain, blood in stool and vomiting.  Endocrine: Negative for cold intolerance, heat intolerance and polydipsia.  Genitourinary: Negative for decreased urine volume, dysuria and hematuria.  Musculoskeletal: Negative for arthralgias, back pain and gait problem.  Skin: Negative for rash.  Allergic/Immunologic: Negative for environmental allergies.  Neurological: Positive for headaches. Negative for seizures, syncope and light-headedness.  Hematological: Negative for adenopathy.  Psychiatric/Behavioral: Negative for agitation, dysphoric mood and suicidal ideas. The patient is not nervous/anxious.     Per HPI unless specifically indicated above     Objective:    BP 136/64 (BP Location: Left Arm, Patient Position: Sitting, Cuff Size: Large)   Pulse 89   Temp 98.6 F (37 C)   Ht 5' 4.75" (1.645 m)   Wt 202 lb 8 oz (91.9 kg)   SpO2 99%   BMI 33.96 kg/m  Wt Readings from Last 3 Encounters:  03/17/17 202 lb 8 oz (91.9 kg)  03/08/17 192 lb (87.1 kg)  02/02/17 198 lb 8 oz (90 kg)    Physical Exam  Constitutional: She is oriented to person, place, and time. She appears well-developed and well-nourished.  HENT:  Head: Normocephalic and atraumatic.  Neck: Neck supple.  Cardiovascular: Normal rate and regular rhythm.   Pulmonary/Chest: Effort normal and breath sounds normal.  Abdominal: Soft. Bowel sounds are normal. She exhibits no mass. There is no hepatosplenomegaly.  There is no tenderness.  Musculoskeletal: She exhibits no edema.  Lymphadenopathy:    She has no cervical adenopathy.  Neurological: She is alert and oriented to person, place, and time.  Skin: Skin is warm and dry.  Psychiatric: She has a normal mood and affect. Her behavior is normal.  Vitals reviewed.       Assessment & Plan:   Encounter Diagnoses  Name Primary?  . 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 Gastroesophageal reflux disease, esophagitis presence not specified      -pt needs to call Financial Office at [facility] so she can get in to see eye dr.  Abbott Pao prefers something close but there is nothing available to her -will Refer to neurology for neurofibromatosis - she was given [facility] discount application to turn in  -pt will follow up in 3 months.  She will RTO sooner prn

## 2017-03-17 NOTE — Patient Instructions (Addendum)
[facility]:  Financial counseling [phone]

## 2017-03-22 ENCOUNTER — Encounter: Payer: Self-pay | Admitting: Neurology

## 2017-04-05 ENCOUNTER — Ambulatory Visit: Payer: Self-pay | Admitting: Physician Assistant

## 2017-04-05 ENCOUNTER — Other Ambulatory Visit (HOSPITAL_COMMUNITY)
Admission: RE | Admit: 2017-04-05 | Discharge: 2017-04-05 | Disposition: A | Discharge status: Home / Self Care | Payer: Self-pay | Source: Other Acute Inpatient Hospital | Attending: Physician Assistant | Admitting: Physician Assistant

## 2017-04-05 ENCOUNTER — Encounter: Payer: Self-pay | Admitting: Physician Assistant

## 2017-04-05 VITALS — BP 112/71 | HR 94 | Temp 97.9°F | Ht 64.75 in

## 2017-04-05 DIAGNOSIS — N3090 Cystitis, unspecified without hematuria: Secondary | ICD-10-CM

## 2017-04-05 DIAGNOSIS — R300 Dysuria: Secondary | ICD-10-CM | POA: Insufficient documentation

## 2017-04-05 DIAGNOSIS — R109 Unspecified abdominal pain: Secondary | ICD-10-CM

## 2017-04-05 LAB — POCT URINALYSIS DIPSTICK
BILIRUBIN UA: NEGATIVE
GLUCOSE UA: NEGATIVE
KETONES UA: NEGATIVE
NITRITE UA: NEGATIVE
Spec Grav, UA: >=1.03 — AB (ref 1.010–1.025)
Urobilinogen, UA: 0.2 E.U./dL
pH, UA: 5 (ref 5.0–8.0)

## 2017-04-05 MED ORDER — CIPROFLOXACIN HCL 500 MG PO TABS: 500.0000 mg | ORAL_TABLET | Freq: Two times a day (BID) | ORAL | 0 refills | Status: DC

## 2017-04-05 NOTE — Progress Notes (Signed)
BP 112/71   Pulse 94   Temp 97.9 F (36.6 C)   Ht 5' 4.75" (1.645 m)   SpO2 100%    Subjective:    Patient ID: Kristy Christian, female    DOB: [birth year], 56 y.o.   MRN: [id number]  HPI: Kristy Christian is a 56 y.o. female presenting on 04/05/2017 for Dysuria   HPI   Pt c/o diarrhea.  She feels like she has stomach bug.   Diarrhea for several weeks.  She feels like she usually does when she has UTI.  Sometimes 4 episodes diarrhea in a day.  She says it has a little bit of form to it. No blood.   Relevant past medical, surgical, family and social history reviewed and updated as indicated. Interim medical history since our last visit reviewed. Allergies and medications reviewed and updated.    Current Outpatient Prescriptions:  .  omeprazole (PRILOSEC) 40 MG capsule, Take 1 capsule (40 mg total) by mouth daily. (Patient taking differently: Take 40 mg by mouth 2 (two) times daily. ), Disp: 90 capsule, Rfl: 1 .  Cyanocobalamin (VITAMIN B 12 PO), Take 2 capsules by mouth daily., Disp: , Rfl:  .  Ginkgo Biloba (GINKOBA) 40 MG TABS, Take 1 tablet by mouth daily., Disp: , Rfl:  .  Omega-3 Fatty Acids (FISH OIL OMEGA-3) 1000 MG CAPS, Take 1 capsule by mouth 2 (two) times daily. , Disp: , Rfl:   Review of Systems  Constitutional: Positive for appetite change. Negative for chills, diaphoresis, fatigue, fever and unexpected weight change.  HENT: Negative for congestion, drooling, ear pain, facial swelling, hearing loss, mouth sores, sneezing, sore throat, trouble swallowing and voice change.   Eyes: Negative for pain, discharge, redness, itching and visual disturbance.  Respiratory: Negative for cough, choking, shortness of breath and wheezing.   Cardiovascular: Negative for chest pain, palpitations and leg swelling.  Gastrointestinal: Positive for diarrhea. Negative for abdominal pain, blood in stool, constipation and vomiting.  Endocrine: Negative for cold intolerance, heat intolerance and  polydipsia.  Genitourinary: Positive for dysuria. Negative for decreased urine volume and hematuria.  Musculoskeletal: Negative for arthralgias, back pain and gait problem.  Skin: Negative for rash.  Allergic/Immunologic: Negative for environmental allergies.  Neurological: Positive for headaches. Negative for seizures, syncope and light-headedness.  Hematological: Negative for adenopathy.  Psychiatric/Behavioral: Negative for agitation, dysphoric mood and suicidal ideas. The patient is not nervous/anxious.     Per HPI unless specifically indicated above     Objective:    BP 112/71   Pulse 94   Temp 97.9 F (36.6 C)   Ht 5' 4.75" (1.645 m)   SpO2 100%   Wt Readings from Last 3 Encounters:  03/17/17 202 lb 8 oz (91.9 kg)  03/08/17 192 lb (87.1 kg)  02/02/17 198 lb 8 oz (90 kg)    Physical Exam  Constitutional: She is oriented to person, place, and time. She appears well-developed and well-nourished.  HENT:  Head: Normocephalic and atraumatic.  Neck: Neck supple.  Cardiovascular: Normal rate and regular rhythm.   Pulmonary/Chest: Effort normal and breath sounds normal.  Abdominal: Soft. Bowel sounds are normal. She exhibits no mass. There is no hepatosplenomegaly. There is no tenderness.  Musculoskeletal: She exhibits no edema.  Lymphadenopathy:    She has no cervical adenopathy.  Neurological: She is alert and oriented to person, place, and time.  Skin: Skin is warm and dry.  Psychiatric: She has a normal mood and affect. Her behavior is normal.  Vitals reviewed.   Results for orders placed or performed in visit on 03/08/17  POCT Urinalysis Dipstick  Result Value Ref Range   Color, UA Dark Yellow    Clarity, UA Cloudy    Glucose, UA NEG    Bilirubin, UA SMALL    Ketones, UA 15 mg/dL    Spec Grav, UA 1.020 1.010 - 1.025   Blood, UA SMALL    pH, UA 5.5 5.0 - 8.0   Protein, UA 30    Urobilinogen, UA 1.0 0.2 or 1.0 E.U./dL   Nitrite, UA NEG    Leukocytes, UA Small  (1+) (A) Negative      Assessment & Plan:   Encounter Diagnoses  Name Primary?  . Cystitis Yes  . Flank pain      -Culture urine  -rx cipro -follow up as scheduled.  RTO sooner prn

## 2017-04-07 LAB — URINE CULTURE: Culture: <10000 — AB

## 2017-05-24 ENCOUNTER — Ambulatory Visit: Payer: Self-pay | Admitting: Neurology

## 2017-07-21 ENCOUNTER — Ambulatory Visit: Payer: Self-pay | Admitting: Physician Assistant

## 2017-07-26 ENCOUNTER — Encounter: Payer: Self-pay | Admitting: Physician Assistant

## 2017-08-21 ENCOUNTER — Encounter (HOSPITAL_COMMUNITY): Payer: Self-pay | Admitting: Emergency Medicine

## 2017-08-21 ENCOUNTER — Emergency Department (HOSPITAL_COMMUNITY)
Admission: EM | Admit: 2017-08-21 | Discharge: 2017-08-21 | Disposition: A | Discharge status: Home / Self Care | Payer: Self-pay | Attending: Emergency Medicine | Admitting: Emergency Medicine

## 2017-08-21 ENCOUNTER — Emergency Department (HOSPITAL_COMMUNITY): Payer: Self-pay

## 2017-08-21 DIAGNOSIS — R05 Cough: Secondary | ICD-10-CM

## 2017-08-21 DIAGNOSIS — R059 Cough, unspecified: Secondary | ICD-10-CM

## 2017-08-21 DIAGNOSIS — F17210 Nicotine dependence, cigarettes, uncomplicated: Secondary | ICD-10-CM | POA: Insufficient documentation

## 2017-08-21 DIAGNOSIS — E876 Hypokalemia: Secondary | ICD-10-CM | POA: Insufficient documentation

## 2017-08-21 DIAGNOSIS — J209 Acute bronchitis, unspecified: Secondary | ICD-10-CM | POA: Insufficient documentation

## 2017-08-21 DIAGNOSIS — N390 Urinary tract infection, site not specified: Secondary | ICD-10-CM | POA: Insufficient documentation

## 2017-08-21 DIAGNOSIS — Z79899 Other long term (current) drug therapy: Secondary | ICD-10-CM | POA: Insufficient documentation

## 2017-08-21 LAB — CBC WITH DIFFERENTIAL/PLATELET
Basophils Absolute: 0.1 10*3/uL (ref 0.0–0.1)
Basophils Relative: 1 %
EOS ABS: 0 10*3/uL (ref 0.0–0.7)
Eosinophils Relative: 0 %
HCT: 34.9 % — ABNORMAL LOW (ref 36.0–46.0)
HEMOGLOBIN: 12.4 g/dL (ref 12.0–15.0)
LYMPHS ABS: 0.7 10*3/uL (ref 0.7–4.0)
LYMPHS PCT: 5 %
MCH: 28.6 pg (ref 26.0–34.0)
MCHC: 35.5 g/dL (ref 30.0–36.0)
MCV: 80.4 fL (ref 78.0–100.0)
MONOS PCT: 7 %
Monocytes Absolute: 0.8 10*3/uL (ref 0.1–1.0)
NEUTROS PCT: 87 %
Neutro Abs: 11 10*3/uL — ABNORMAL HIGH (ref 1.7–7.7)
Platelets: 262 10*3/uL (ref 150–400)
RBC: 4.34 MIL/uL (ref 3.87–5.11)
RDW: 13.3 % (ref 11.5–15.5)
WBC: 12.6 10*3/uL — ABNORMAL HIGH (ref 4.0–10.5)

## 2017-08-21 LAB — URINALYSIS, ROUTINE W REFLEX MICROSCOPIC
BILIRUBIN URINE: NEGATIVE
Glucose, UA: NEGATIVE mg/dL
Ketones, ur: 5 mg/dL — AB
NITRITE: NEGATIVE
PH: 5 (ref 5.0–8.0)
Protein, ur: 30 mg/dL — AB
SPECIFIC GRAVITY, URINE: 1.027 (ref 1.005–1.030)

## 2017-08-21 LAB — I-STAT CHEM 8, ED
BUN: 10 mg/dL (ref 6–20)
CHLORIDE: 99 mmol/L — AB (ref 101–111)
Calcium, Ion: 1.06 mmol/L — ABNORMAL LOW (ref 1.15–1.40)
Creatinine, Ser: 0.6 mg/dL (ref 0.44–1.00)
Glucose, Bld: 115 mg/dL — ABNORMAL HIGH (ref 65–99)
HEMATOCRIT: 31 % — AB (ref 36.0–46.0)
Hemoglobin: 10.5 g/dL — ABNORMAL LOW (ref 12.0–15.0)
Potassium: 3 mmol/L — ABNORMAL LOW (ref 3.5–5.1)
SODIUM: 136 mmol/L (ref 135–145)
TCO2: 25 mmol/L (ref 22–32)

## 2017-08-21 LAB — D-DIMER, QUANTITATIVE: D-Dimer, Quant: <0.27 ug/mL-FEU (ref 0.00–0.50)

## 2017-08-21 LAB — I-STAT CG4 LACTIC ACID, ED: LACTIC ACID, VENOUS: 1.71 mmol/L (ref 0.5–1.9)

## 2017-08-21 MED ORDER — AZITHROMYCIN 250 MG PO TABS: 250.0000 mg | ORAL_TABLET | Freq: Every day | ORAL | 0 refills | Status: DC

## 2017-08-21 MED ORDER — POTASSIUM CHLORIDE CRYS ER 20 MEQ PO TBCR
EXTENDED_RELEASE_TABLET | ORAL | Status: AC
  Filled 2017-08-21: qty 1

## 2017-08-21 MED ORDER — DEXAMETHASONE SODIUM PHOSPHATE 10 MG/ML IJ SOLN
10.0000 mg | Freq: Once | INTRAMUSCULAR | Status: AC
  Administered 2017-08-21: 10 mg via INTRAMUSCULAR
  Filled 2017-08-21: qty 1

## 2017-08-21 MED ORDER — POTASSIUM CHLORIDE CRYS ER 20 MEQ PO TBCR
20.0000 meq | EXTENDED_RELEASE_TABLET | Freq: Once | ORAL | Status: AC
  Administered 2017-08-21: 20 meq via ORAL
  Filled 2017-08-21: qty 1

## 2017-08-21 MED ORDER — SODIUM CHLORIDE 0.9 % IV BOLUS (SEPSIS)
1000.0000 mL | Freq: Once | INTRAVENOUS | Status: AC
  Administered 2017-08-21: 1000 mL via INTRAVENOUS

## 2017-08-21 MED ORDER — BENZONATATE 100 MG PO CAPS
200.0000 mg | ORAL_CAPSULE | Freq: Once | ORAL | Status: AC
  Administered 2017-08-21: 200 mg via ORAL
  Filled 2017-08-21: qty 2

## 2017-08-21 MED ORDER — ALBUTEROL SULFATE HFA 108 (90 BASE) MCG/ACT IN AERS
1.0000 | INHALATION_SPRAY | Freq: Once | RESPIRATORY_TRACT | Status: AC
  Administered 2017-08-21: 1 via RESPIRATORY_TRACT

## 2017-08-21 MED ORDER — ALBUTEROL SULFATE (2.5 MG/3ML) 0.083% IN NEBU
2.5000 mg | INHALATION_SOLUTION | Freq: Once | RESPIRATORY_TRACT | Status: AC
  Administered 2017-08-21: 2.5 mg via RESPIRATORY_TRACT
  Filled 2017-08-21: qty 3

## 2017-08-21 MED ORDER — SODIUM CHLORIDE 0.9 % IV SOLN: INTRAVENOUS | Status: DC

## 2017-08-21 MED ORDER — CEPHALEXIN 500 MG PO CAPS: 500.0000 mg | ORAL_CAPSULE | Freq: Four times a day (QID) | ORAL | 0 refills | Status: AC

## 2017-08-21 MED ORDER — BENZONATATE 100 MG PO CAPS: 200.0000 mg | ORAL_CAPSULE | Freq: Three times a day (TID) | ORAL | 0 refills | Status: DC | PRN

## 2017-08-21 MED ORDER — POTASSIUM CHLORIDE CRYS ER 20 MEQ PO TBCR
40.0000 meq | EXTENDED_RELEASE_TABLET | Freq: Once | ORAL | Status: AC
  Administered 2017-08-21: 40 meq via ORAL
  Filled 2017-08-21: qty 2

## 2017-08-21 MED ORDER — ALBUTEROL SULFATE HFA 108 (90 BASE) MCG/ACT IN AERS
INHALATION_SPRAY | RESPIRATORY_TRACT | Status: AC
  Administered 2017-08-21: 15:00:00
  Filled 2017-08-21: qty 6.7

## 2017-08-21 MED ORDER — IPRATROPIUM-ALBUTEROL 0.5-2.5 (3) MG/3ML IN SOLN
3.0000 mL | Freq: Four times a day (QID) | RESPIRATORY_TRACT | Status: DC
  Administered 2017-08-21: 3 mL via RESPIRATORY_TRACT
  Filled 2017-08-21: qty 3

## 2017-08-21 NOTE — ED Notes (Signed)
Pt returned from xray

## 2017-08-21 NOTE — ED Notes (Signed)
Patient transported to X-ray 

## 2017-08-21 NOTE — ED Notes (Signed)
Get pt a warm blanket and she tells this nurse that she had been seen at [facility] yesterday and that they wanted to admit observation but pt refused.  When asked what her dx was, pt didn't know but did state she had an UTI,

## 2017-08-21 NOTE — ED Triage Notes (Signed)
Pt reports cough for the past week with yellow sputum.  Denies fever.

## 2017-08-21 NOTE — Discharge Instructions (Addendum)
Continue taking the keflex as this should cover you for your lung infection as well as your urinary infection. Add the tessalon if this helps with coughing. Use your inhaler given - 2 puffs every 4 hours if you are coughing or wheezing. Make sure you are drinking plenty of fluids.  Your lab work is ok today, your potassium level is low which can help explain weakness.  You have received a dose here - I recommend eating a banana daily for the next week to help completely replace this level.  Refer to the resource guide printed below. You may also call this hospital on Monday and ask to talk to a social worker about any other possible resources for housing and medical assistance.

## 2017-08-21 NOTE — ED Notes (Signed)
Pt with cough for a week, productive.  Mucus is yellow in color.  Pt states that she is homeless.
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
## 2017-08-23 LAB — URINE CULTURE: CULTURE: NO GROWTH

## 2017-09-22 ENCOUNTER — Other Ambulatory Visit: Payer: Self-pay

## 2017-09-22 ENCOUNTER — Encounter (HOSPITAL_COMMUNITY): Payer: Self-pay | Admitting: Emergency Medicine

## 2017-09-22 ENCOUNTER — Emergency Department (HOSPITAL_COMMUNITY)
Admission: EM | Admit: 2017-09-22 | Discharge: 2017-09-22 | Disposition: A | Discharge status: Home / Self Care | Payer: Self-pay | Attending: Emergency Medicine | Admitting: Emergency Medicine

## 2017-09-22 DIAGNOSIS — Z5321 Procedure and treatment not carried out due to patient leaving prior to being seen by health care provider: Secondary | ICD-10-CM | POA: Insufficient documentation

## 2017-09-22 LAB — COMPREHENSIVE METABOLIC PANEL
ALT: 10 U/L — AB (ref 14–54)
ANION GAP: 12 (ref 5–15)
AST: 17 U/L (ref 15–41)
Albumin: 4 g/dL (ref 3.5–5.0)
Alkaline Phosphatase: 90 U/L (ref 38–126)
BUN: 13 mg/dL (ref 6–20)
CHLORIDE: 99 mmol/L — AB (ref 101–111)
CO2: 24 mmol/L (ref 22–32)
CREATININE: 0.77 mg/dL (ref 0.44–1.00)
Calcium: 9.3 mg/dL (ref 8.9–10.3)
GFR calc non Af Amer: >60 mL/min (ref >60)
Glucose, Bld: 105 mg/dL — ABNORMAL HIGH (ref 65–99)
POTASSIUM: 3.5 mmol/L (ref 3.5–5.1)
SODIUM: 135 mmol/L (ref 135–145)
Total Bilirubin: 1.2 mg/dL (ref 0.3–1.2)
Total Protein: 7.2 g/dL (ref 6.5–8.1)

## 2017-09-22 LAB — CBC WITH DIFFERENTIAL/PLATELET
Basophils Absolute: 0.1 10*3/uL (ref 0.0–0.1)
Basophils Relative: 1 %
Eosinophils Absolute: 0.2 10*3/uL (ref 0.0–0.7)
Eosinophils Relative: 2 %
HEMATOCRIT: 39.8 % (ref 36.0–46.0)
HEMOGLOBIN: 13.7 g/dL (ref 12.0–15.0)
LYMPHS ABS: 1.1 10*3/uL (ref 0.7–4.0)
LYMPHS PCT: 12 %
MCH: 28.2 pg (ref 26.0–34.0)
MCHC: 34.4 g/dL (ref 30.0–36.0)
MCV: 81.9 fL (ref 78.0–100.0)
MONOS PCT: 8 %
Monocytes Absolute: 0.7 10*3/uL (ref 0.1–1.0)
NEUTROS ABS: 7.2 10*3/uL (ref 1.7–7.7)
NEUTROS PCT: 77 %
Platelets: 218 10*3/uL (ref 150–400)
RBC: 4.86 MIL/uL (ref 3.87–5.11)
RDW: 14 % (ref 11.5–15.5)
WBC: 9.3 10*3/uL (ref 4.0–10.5)

## 2017-09-22 LAB — LIPASE, BLOOD: LIPASE: 23 U/L (ref 11–51)

## 2017-09-22 NOTE — ED Triage Notes (Signed)
Pt c/o sudden mid abd pain with nausea today. Denies diarrhea. Nad. Denies gu sx

## 2017-09-22 NOTE — ED Notes (Signed)
Called no answer

## 2017-09-22 NOTE — ED Notes (Signed)
Blood drawn by this RN, updated on wait time. Pt is calm and understanding.

## 2018-12-01 DIAGNOSIS — Z87440 Personal history of urinary (tract) infections: Secondary | ICD-10-CM | POA: Insufficient documentation

## 2020-05-13 DIAGNOSIS — K56609 Unspecified intestinal obstruction, unspecified as to partial versus complete obstruction: Secondary | ICD-10-CM | POA: Insufficient documentation

## 2020-06-26 ENCOUNTER — Encounter (INDEPENDENT_AMBULATORY_CARE_PROVIDER_SITE_OTHER): Payer: Self-pay | Admitting: Gastroenterology

## 2020-07-15 ENCOUNTER — Ambulatory Visit (INDEPENDENT_AMBULATORY_CARE_PROVIDER_SITE_OTHER): Payer: Self-pay | Admitting: Gastroenterology

## 2021-01-30 ENCOUNTER — Other Ambulatory Visit (HOSPITAL_COMMUNITY): Payer: Self-pay | Admitting: Internal Medicine

## 2021-01-30 DIAGNOSIS — Z1231 Encounter for screening mammogram for malignant neoplasm of breast: Secondary | ICD-10-CM

## 2021-02-12 ENCOUNTER — Encounter: Payer: Self-pay | Admitting: *Deleted

## 2021-02-12 ENCOUNTER — Ambulatory Visit (HOSPITAL_COMMUNITY): Payer: Medicare Other

## 2021-02-27 ENCOUNTER — Other Ambulatory Visit: Payer: Self-pay

## 2021-02-27 ENCOUNTER — Ambulatory Visit (HOSPITAL_COMMUNITY)
Admission: RE | Admit: 2021-02-27 | Discharge: 2021-02-27 | Disposition: A | Discharge status: Home / Self Care | Payer: Medicare Other | Source: Ambulatory Visit | Attending: Internal Medicine | Admitting: Internal Medicine

## 2021-02-27 DIAGNOSIS — Z1231 Encounter for screening mammogram for malignant neoplasm of breast: Secondary | ICD-10-CM | POA: Diagnosis present

## 2021-03-04 ENCOUNTER — Ambulatory Visit (INDEPENDENT_AMBULATORY_CARE_PROVIDER_SITE_OTHER): Payer: Self-pay | Admitting: *Deleted

## 2021-03-04 ENCOUNTER — Other Ambulatory Visit: Payer: Self-pay

## 2021-03-04 VITALS — Ht 65.0 in | Wt 185.2 lb

## 2021-03-04 DIAGNOSIS — Z1211 Encounter for screening for malignant neoplasm of colon: Secondary | ICD-10-CM

## 2021-03-04 NOTE — Progress Notes (Signed)
Pt came in and informed me that she wanted to be scheduled for an EGD/TCS.  She said that she has acid reflux and barrett's.  Pt made aware that she would need ov since she is having problems and requesting EGD as well.  Scheduled ov for 03/05/2021 at 11:30 with Roseanne Kaufman, NP.  Pt voiced understanding.

## 2021-03-05 ENCOUNTER — Encounter: Payer: Self-pay | Admitting: Gastroenterology

## 2021-03-05 ENCOUNTER — Encounter: Payer: Self-pay | Admitting: *Deleted

## 2021-03-05 ENCOUNTER — Ambulatory Visit (INDEPENDENT_AMBULATORY_CARE_PROVIDER_SITE_OTHER): Payer: Medicare Other | Admitting: Gastroenterology

## 2021-03-05 VITALS — BP 128/72 | HR 80 | Temp 97.1°F | Ht 65.0 in | Wt 185.2 lb

## 2021-03-05 DIAGNOSIS — R197 Diarrhea, unspecified: Secondary | ICD-10-CM

## 2021-03-05 DIAGNOSIS — Z1211 Encounter for screening for malignant neoplasm of colon: Secondary | ICD-10-CM | POA: Insufficient documentation

## 2021-03-05 DIAGNOSIS — K5909 Other constipation: Secondary | ICD-10-CM

## 2021-03-05 DIAGNOSIS — Z8719 Personal history of other diseases of the digestive system: Secondary | ICD-10-CM

## 2021-03-05 MED ORDER — PEG 3350-KCL-NA BICARB-NACL 420 G PO SOLR: ORAL | 0 refills | Status: DC

## 2021-03-05 MED ORDER — PANTOPRAZOLE SODIUM 40 MG PO TBEC: 40.0000 mg | DELAYED_RELEASE_TABLET | Freq: Every day | ORAL | 3 refills | Status: DC

## 2021-03-05 NOTE — Patient Instructions (Signed)
We are arranging a colonoscopy and upper endoscopy in the near future!  Due to your history of Barrett's, I have sent in a stronger medication called Protonix. Take this once each morning, 30 minutes before breakfast.  To help with bowel habits, you can take Benefiber or generic equivalent 2 teaspoons each morning.   Further recommendations to follow!  It was a pleasure to see you today. I want to create trusting relationships with patients to provide genuine, compassionate, and quality care. I value your feedback. If you receive a survey regarding your visit,  I greatly appreciate you taking time to fill this out.   Annitta Needs, PhD, ANP-BC [facility]

## 2021-03-05 NOTE — Progress Notes (Signed)
Primary Care Physician:  Rosita Fire, MD  Referring Physician: Dr. Abbey Chatters Primary Gastroenterologist:  Dr. Abbey Chatters  Chief Complaint  Patient presents with  . Gastroesophageal Reflux    Was told in [place] she may have Barrett's. Requesting EGD  . Colonoscopy    Last tcs 2012 or 2013 [place]  . Constipation  . Diarrhea    HPI:   Kristy Christian is a 60 y.o. female presenting today at the request of Dr. Legrand Rams for screening colonoscopy. She also has a self-reported history of Barrett's and will need EGD at time of colonoscpoy.   Last colonoscopy in 2012 or 2013 prior to moving here from [place]. Normal per patient. Records not available. Had an endoscopy at that time and was told may have Barrett's.   Intermittent GERD. Takes antacids. No PPI currently.   Has intermittent constipation and diarrhea. Sometimes will take stool softeners. No rectal bleeding. No abdominal pain. No dysphagia.   Past Medical History:  Diagnosis Date  . GERD (gastroesophageal reflux disease)   . IBS (irritable bowel syndrome)   . Knee pain   . Neurofibromatosis ([place])   . UTI (lower urinary tract infection)     Past Surgical History:  Procedure Laterality Date  . BREAST LUMPECTOMY  2009  . BREAST SURGERY    . [place]  . CHOLECYSTECTOMY  2007    Current Outpatient Medications  Medication Sig Dispense Refill  . Calcium Carbonate Antacid (ANTACID PO) Take by mouth. Takes different OTC antacids daily    . Cyanocobalamin (VITAMIN B-12) 5000 MCG TBDP Take by mouth daily.    . Glucosamine-Chondroitin (MOVE FREE PO) Take by mouth 2 (two) times daily.    . pantoprazole (PROTONIX) 40 MG tablet Take 1 tablet (40 mg total) by mouth daily. Take 30 minutes before breakfast daily 30 tablet 3  . polyethylene glycol-electrolytes (NULYTELY) 420 g solution As directed 4000 mL 0   No current facility-administered medications for this visit.    Allergies as of 03/05/2021   . (No Known Allergies)    Family History  Problem Relation Age of Onset  . Arthritis Mother   . Cancer Mother        liver  . Arthritis Father   . Hypertension Father   . Stroke Father   . Heart attack Father   . Other Other        arthralgia, multi sites  . Hypertension Other   . Cancer Other        breast; aunt  . Colon cancer Neg Hx   . Colon polyps Neg Hx     Social History   Socioeconomic History  . Marital status: Divorced    Spouse name: Not on file  . Number of children: Not on file  . Years of education: Not on file  . Highest education level: Not on file  Occupational History  . Not on file  Tobacco Use  . Smoking status: Former Smoker    Packs/day: 0.50    Years: 39.00    Pack years: 19.50    Types: Cigarettes  . Smokeless tobacco: Never Used  . Tobacco comment: quit around 2017   Substance and Sexual Activity  . Alcohol use: Yes    Alcohol/week: 0.0 standard drinks    Comment: occ  . Drug use: No    Types: "Crack" cocaine    Comment: none since 1988  . Sexual activity: Yes    Birth  control/protection: Surgical  Other Topics Concern  . Not on file  Social History Narrative  . Not on file   Social Determinants of Health   Financial Resource Strain: Not on file  Food Insecurity: Not on file  Transportation Needs: Not on file  Physical Activity: Not on file  Stress: Not on file  Social Connections: Not on file  Intimate Partner Violence: Not on file    Review of Systems: Gen: Denies any fever, chills, fatigue, weight loss, lack of appetite.  CV: Denies chest pain, heart palpitations, peripheral edema, syncope.  Resp: Denies shortness of breath at rest or with exertion. Denies wheezing or cough.  GI: see HPI                  GU : Denies urinary burning, urinary frequency, urinary hesitancy MS: Denies joint pain, muscle weakness, cramps, or limitation of movement.  Derm: Denies rash, itching, dry skin Psych: Denies depression, anxiety,  memory loss, and confusion Heme: Denies bruising, bleeding, and enlarged lymph nodes.  Physical Exam: BP 128/72   Pulse 80   Temp (!) 97.1 F (36.2 C) (Temporal)   Ht 5\' 5"  (1.651 m)   Wt 185 lb 3.2 oz (84 kg)   BMI 30.82 kg/m  General:   Alert and oriented. Pleasant and cooperative. Well-nourished and well-developed. Multiple scattered neuro fibromatous tumors Head:  Normocephalic and atraumatic. Eyes:  Without icterus, sclera clear and conjunctiva pink.  Ears:  Normal auditory acuity. Mouth:  Mask in place Lungs:  Clear to auscultation bilaterally. No wheezes, rales, or rhonchi. No distress.  Heart:  S1, S2 present without murmurs appreciated.  Abdomen:  +BS, soft, non-tender and non-distended. No HSM noted. No guarding or rebound. No masses appreciated.  Rectal:  Deferred  Msk:  Symmetrical without gross deformities. Normal posture. Extremities:  Without edema. Neurologic:  Alert and  oriented x4;  grossly normal neurologically. Skin:  Diffuse neurofibromatosis  Psych:  Alert and cooperative. Normal mood and affect.  ASSESSMENT: Kristy Christian is a 60 y.o. female presenting today with need for screening colonoscopy, with last around 10 years ago reportedly normal in [place]. She also notes history of chronic GERD, with possible Barrett's and last EGD around 10 years ago as well.   Currently, she is not on a PPI. With history of Barrett's, I discussed need for daily PPI therapy indefinitely. We will pursue surveillance EGD at time of colonoscopy. No family history of colorectal cancer or polyps; she denies any concerning lower GI signs/symptoms.    PLAN: Proceed with colonoscopy/EGD by Dr. Abbey Chatters  in near future: the risks, benefits, and alternatives have been discussed with the patient in detail. The patient states understanding and desires to proceed.   Start Protonix once daily  Further recommendations to follow  Annitta Needs, PhD, ANP-BC [facility]

## 2021-03-17 NOTE — Progress Notes (Signed)
Noted  

## 2021-03-18 ENCOUNTER — Ambulatory Visit: Payer: Medicare Other | Admitting: Urology

## 2021-04-03 ENCOUNTER — Telehealth: Payer: Self-pay | Admitting: *Deleted

## 2021-04-03 NOTE — Telephone Encounter (Signed)
Received VM from pt. She needs to r/s procedure with Dr. Abbey Chatters. Patient rescheduled to 6/27 at 7:30am. Patient aware will mail new prep instructions with new covid test appt.

## 2021-04-09 ENCOUNTER — Other Ambulatory Visit (HOSPITAL_COMMUNITY): Payer: Medicare Other

## 2021-05-02 ENCOUNTER — Ambulatory Visit (INDEPENDENT_AMBULATORY_CARE_PROVIDER_SITE_OTHER): Payer: Medicare Other | Admitting: Urology

## 2021-05-02 ENCOUNTER — Other Ambulatory Visit: Payer: Self-pay

## 2021-05-02 ENCOUNTER — Encounter: Payer: Self-pay | Admitting: Urology

## 2021-05-02 VITALS — BP 106/71 | HR 90 | Temp 98.4°F | Resp 16 | Ht 65.0 in | Wt 185.0 lb

## 2021-05-02 DIAGNOSIS — N390 Urinary tract infection, site not specified: Secondary | ICD-10-CM

## 2021-05-02 LAB — URINALYSIS, ROUTINE W REFLEX MICROSCOPIC
Bilirubin, UA: NEGATIVE
Glucose, UA: NEGATIVE
Ketones, UA: NEGATIVE
Nitrite, UA: NEGATIVE
Protein,UA: NEGATIVE
RBC, UA: NEGATIVE
Specific Gravity, UA: 1.025 (ref 1.005–1.030)
Urobilinogen, Ur: 1 mg/dL (ref 0.2–1.0)
pH, UA: 5.5 (ref 5.0–7.5)

## 2021-05-02 LAB — MICROSCOPIC EXAMINATION: Bacteria, UA: NONE SEEN

## 2021-05-02 LAB — BLADDER SCAN AMB NON-IMAGING: Scan Result: 12

## 2021-05-02 NOTE — Progress Notes (Signed)
post void residual=12 

## 2021-05-02 NOTE — Progress Notes (Signed)
05/02/2021 11:32 AM   Kristy Christian July 26, 1961 [id number]  Referring provider: Rosita Fire, MD [address] [place],  [place] [address]  Chief Complaint  Patient presents with   New Patient (Initial Visit)    HPI: Ms Treiber is a 60yo here for evaluation for recurrent UTI. She gets 5-6 UTIs per year for over 5 years. She gets foul smelling urine and pelvic pain with the UTIs. She has had 3 stone events with her last stone event was in the 1990s. She denies any significant LUTS. No dysuria or hematuria. PVR 12cc.  She has a hx of neurofibromitosis.    PMH: Past Medical History:  Diagnosis Date   GERD (gastroesophageal reflux disease)    IBS (irritable bowel syndrome)    Knee pain    Neurofibromatosis ([place])    UTI (lower urinary tract infection)     Surgical History: Past Surgical History:  Procedure Laterality Date   BREAST LUMPECTOMY  2009   BREAST SURGERY     CESAREAN SECTION  1980 and 1982   CHOLECYSTECTOMY  2007    Home Medications:  Allergies as of 05/02/2021   No Known Allergies      Medication List        Accurate as of May 02, 2021 11:32 AM. If you have any questions, ask your nurse or doctor.          STOP taking these medications    ANTACID PO Stopped by: Nicolette Bang, MD   polyethylene glycol-electrolytes 420 g solution Commonly known as: NuLYTELY Stopped by: Nicolette Bang, MD       TAKE these medications    MOVE FREE PO Take by mouth 2 (two) times daily.   pantoprazole 40 MG tablet Commonly known as: PROTONIX Take 1 tablet (40 mg total) by mouth daily. Take 30 minutes before breakfast daily   Vitamin B-12 5000 MCG Tbdp Take by mouth daily.        Allergies: No Known Allergies  Family History: Family History  Problem Relation Age of Onset   Arthritis Mother    Cancer Mother        liver   Arthritis Father    Hypertension Father    Stroke Father    Heart attack Father    Other Other         arthralgia, multi sites   Hypertension Other    Cancer Other        breast; aunt   Colon cancer Neg Hx    Colon polyps Neg Hx     Social History:  reports that she has quit smoking. Her smoking use included cigarettes. She has a 19.50 pack-year smoking history. She has never used smokeless tobacco. She reports current alcohol use. She reports that she does not use drugs.  ROS: All other review of systems were reviewed and are negative except what is noted above in HPI  Physical Exam: BP 106/71   Pulse 90   Temp 98.4 F (36.9 C) (Oral)   Resp 16   Ht 5\' 5"  (1.651 m)   Wt 185 lb (83.9 kg)   BMI 30.79 kg/m   Constitutional:  Alert and oriented, No acute distress. HEENT: [place] AT, moist mucus membranes.  Trachea midline, no masses. Cardiovascular: No clubbing, cyanosis, or edema. Respiratory: Normal respiratory effort, no increased work of breathing. GI: Abdomen is soft, nontender, nondistended, no abdominal masses GU: No CVA tenderness.  Lymph: No cervical or inguinal lymphadenopathy. Skin: numerous neurofibromas Neurologic: Grossly intact,  no focal deficits, moving all 4 extremities. Psychiatric: Normal mood and affect.  Laboratory Data: Lab Results  Component Value Date   WBC 9.3 09/22/2017   HGB 13.7 09/22/2017   HCT 39.8 09/22/2017   MCV 81.9 09/22/2017   PLT 218 09/22/2017    Lab Results  Component Value Date   CREATININE 0.77 09/22/2017    No results found for: PSA  No results found for: TESTOSTERONE  No results found for: HGBA1C  Urinalysis    Component Value Date/Time   COLORURINE AMBER (A) 08/21/2017 1801   APPEARANCEUR HAZY (A) 08/21/2017 1801   LABSPEC 1.027 08/21/2017 1801   PHURINE 5.0 08/21/2017 1801   GLUCOSEU NEGATIVE 08/21/2017 1801   HGBUR SMALL (A) 08/21/2017 1801   BILIRUBINUR NEGATIVE 08/21/2017 1801   BILIRUBINUR NEG 04/05/2017 1401   KETONESUR 5 (A) 08/21/2017 1801   PROTEINUR 30 (A) 08/21/2017 1801   UROBILINOGEN 0.2 04/05/2017  1401   UROBILINOGEN 0.2 08/26/2015 1100   NITRITE NEGATIVE 08/21/2017 1801   LEUKOCYTESUR MODERATE (A) 08/21/2017 1801    Lab Results  Component Value Date   BACTERIA FEW (A) 08/21/2017    Pertinent Imaging:  No results found for this or any previous visit.  No results found for this or any previous visit.  No results found for this or any previous visit.  No results found for this or any previous visit.  No results found for this or any previous visit.  No results found for this or any previous visit.  No results found for this or any previous visit.  No results found for this or any previous visit.   Assessment & Plan:    1. Recurrent UTI -We discussed the natural hx of recurrent UTIs and the various causes. We discussed the treatment options including post coital prophylaxis, daily prophylaxis, topical estrogen therapy. We will await her scan from [facility] prior to initiating therapy - Urinalysis, Routine w reflex microscopic - BLADDER SCAN AMB NON-IMAGING   No follow-ups on file.  Nicolette Bang, MD  [facility] [place]

## 2021-05-02 NOTE — Progress Notes (Signed)
Urological Symptom Review  Patient is experiencing the following symptoms: Get up at night to urinate Urinary tract infection   Review of Systems  Gastrointestinal (upper)  : Negative for upper GI symptoms  Gastrointestinal (lower) : Diarrhea Constipation  Constitutional : Negative for symptoms  Skin: Negative for skin symptoms  Eyes: Blurred vision  Ear/Nose/Throat : Sinus problems  Hematologic/Lymphatic: Negative for Hematologic/Lymphatic symptoms  Cardiovascular : Negative for cardiovascular symptoms  Respiratory : Negative for respiratory symptoms  Endocrine: Negative for endocrine symptoms  Musculoskeletal: Negative for musculoskeletal symptoms  Neurological: Negative for neurological symptoms  Psychologic: Negative for psychiatric symptoms

## 2021-05-02 NOTE — Patient Instructions (Signed)

## 2021-05-04 LAB — URINE CULTURE

## 2021-05-09 ENCOUNTER — Other Ambulatory Visit (HOSPITAL_COMMUNITY): Payer: Medicare Other

## 2021-05-12 ENCOUNTER — Encounter: Payer: Self-pay | Admitting: Internal Medicine

## 2021-05-12 ENCOUNTER — Ambulatory Visit (HOSPITAL_COMMUNITY)
Admission: RE | Admit: 2021-05-12 | Discharge: 2021-05-12 | Disposition: A | Discharge status: Home / Self Care | Payer: Medicare Other | Attending: Internal Medicine | Admitting: Internal Medicine

## 2021-05-12 ENCOUNTER — Encounter (HOSPITAL_COMMUNITY)
Admission: RE | Disposition: A | Discharge status: Home / Self Care | Payer: Self-pay | Source: Home / Self Care | Attending: Internal Medicine

## 2021-05-12 ENCOUNTER — Encounter (HOSPITAL_COMMUNITY): Payer: Self-pay

## 2021-05-12 ENCOUNTER — Ambulatory Visit (HOSPITAL_COMMUNITY): Payer: Medicare Other | Admitting: Anesthesiology

## 2021-05-12 ENCOUNTER — Other Ambulatory Visit: Payer: Self-pay

## 2021-05-12 DIAGNOSIS — K648 Other hemorrhoids: Secondary | ICD-10-CM | POA: Insufficient documentation

## 2021-05-12 DIAGNOSIS — Z9049 Acquired absence of other specified parts of digestive tract: Secondary | ICD-10-CM | POA: Insufficient documentation

## 2021-05-12 DIAGNOSIS — K31A0 Gastric intestinal metaplasia, unspecified: Secondary | ICD-10-CM | POA: Insufficient documentation

## 2021-05-12 DIAGNOSIS — R12 Heartburn: Secondary | ICD-10-CM | POA: Diagnosis not present

## 2021-05-12 DIAGNOSIS — Z79899 Other long term (current) drug therapy: Secondary | ICD-10-CM | POA: Diagnosis not present

## 2021-05-12 DIAGNOSIS — K449 Diaphragmatic hernia without obstruction or gangrene: Secondary | ICD-10-CM | POA: Diagnosis not present

## 2021-05-12 DIAGNOSIS — K2270 Barrett's esophagus without dysplasia: Secondary | ICD-10-CM | POA: Insufficient documentation

## 2021-05-12 DIAGNOSIS — Z1211 Encounter for screening for malignant neoplasm of colon: Secondary | ICD-10-CM | POA: Insufficient documentation

## 2021-05-12 DIAGNOSIS — K2970 Gastritis, unspecified, without bleeding: Secondary | ICD-10-CM | POA: Insufficient documentation

## 2021-05-12 DIAGNOSIS — Z87891 Personal history of nicotine dependence: Secondary | ICD-10-CM | POA: Insufficient documentation

## 2021-05-12 HISTORY — PX: COLONOSCOPY WITH PROPOFOL: SHX5780

## 2021-05-12 HISTORY — PX: ESOPHAGOGASTRODUODENOSCOPY (EGD) WITH PROPOFOL: SHX5813

## 2021-05-12 HISTORY — PX: BIOPSY: SHX5522

## 2021-05-12 SURGERY — COLONOSCOPY WITH PROPOFOL
Anesthesia: General

## 2021-05-12 MED ORDER — STERILE WATER FOR IRRIGATION IR SOLN
Status: DC | PRN
  Administered 2021-05-12: 1.5 mL

## 2021-05-12 MED ORDER — LACTATED RINGERS IV SOLN: INTRAVENOUS | Status: DC

## 2021-05-12 MED ORDER — PROPOFOL 500 MG/50ML IV EMUL
INTRAVENOUS | Status: DC | PRN
  Administered 2021-05-12: 125 ug/kg/min via INTRAVENOUS

## 2021-05-12 MED ORDER — PROPOFOL 10 MG/ML IV BOLUS
INTRAVENOUS | Status: DC | PRN
  Administered 2021-05-12: 70 mg via INTRAVENOUS
  Administered 2021-05-12: 30 mg via INTRAVENOUS
  Administered 2021-05-12: 20 mg via INTRAVENOUS
  Administered 2021-05-12: 10 mg via INTRAVENOUS

## 2021-05-12 MED ORDER — LIDOCAINE HCL (CARDIAC) PF 100 MG/5ML IV SOSY
PREFILLED_SYRINGE | INTRAVENOUS | Status: DC | PRN
  Administered 2021-05-12: 50 mg via INTRAVENOUS

## 2021-05-12 NOTE — Op Note (Signed)
[facility] Patient Name: Kristy Christian Procedure Date: 05/12/2021 7:46 AM MRN: [id number] Date of Birth: 05-28-1961 Attending MD: Elon Alas. Abbey Chatters DO CSN: [id number] Age: 60 Admit Type: Outpatient Procedure:                Colonoscopy Indications:              Screening for colorectal malignant neoplasm Providers:                Elon Alas. Abbey Chatters, DO, Caprice Kluver, Raphael Gibney,                            Technician Referring MD:              Medicines:                See the Anesthesia note for documentation of the                            administered medications Complications:            No immediate complications. Estimated Blood Loss:     Estimated blood loss: none. Procedure:                Pre-Anesthesia Assessment:                           - The anesthesia plan was to use monitored                            anesthesia care (MAC).                           After obtaining informed consent, the colonoscope                            was passed under direct vision. Throughout the                            procedure, the patient's blood pressure, pulse, and                            oxygen saturations were monitored continuously. The                            [id number]-HQ190L ([id number]) scope was introduced through                            the anus and advanced to the the cecum, identified                            by appendiceal orifice and ileocecal valve. The                            colonoscopy was performed without difficulty. The                            patient tolerated the procedure well. The quality  of the bowel preparation was evaluated using the                            BBPS [facility] Bowel Preparation Scale) with scores                            of: Right Colon = 3, Transverse Colon = 3 and Left                            Colon = 3 (entire mucosa seen well with no residual                            staining, small fragments  of stool or opaque                            liquid). The total BBPS score equals 9. Scope In: 7:51:47 AM Scope Out: 8:03:16 AM Scope Withdrawal Time: 0 hours 7 minutes 12 seconds  Total Procedure Duration: 0 hours 11 minutes 29 seconds  Findings:      The perianal and digital rectal examinations were normal.      Non-bleeding internal hemorrhoids were found during retroflexion.      The exam was otherwise without abnormality. Impression:               - Non-bleeding internal hemorrhoids.                           - The examination was otherwise normal.                           - No specimens collected. Moderate Sedation:      Per Anesthesia Care Recommendation:           - Patient has a contact number available for                            emergencies. The signs and symptoms of potential                            delayed complications were discussed with the                            patient. Return to normal activities tomorrow.                            Written discharge instructions were provided to the                            patient.                           - Resume previous diet.                           - Continue present medications.                           -  Repeat colonoscopy in 10 years for screening                            purposes.                           - Return to GI clinic in 3 months to discuss                            constipation. Procedure Code(s):        --- Professional ---                           Q1975, Colorectal cancer screening; colonoscopy on                            individual not meeting criteria for high risk Diagnosis Code(s):        --- Professional ---                           Z12.11, Encounter for screening for malignant                            neoplasm of colon                           K64.8, Other hemorrhoids CPT copyright 2019 American Medical Association. All rights reserved. The codes documented in this report  are preliminary and upon coder review may  be revised to meet current compliance requirements. Elon Alas. Abbey Chatters, DO [name], DO 05/12/2021 8:07:01 AM This report has been signed electronically. Number of Addenda: 0

## 2021-05-12 NOTE — Transfer of Care (Signed)
Immediate Anesthesia Transfer of Care Note  Patient: Kristy Christian  Procedure(s) Performed: COLONOSCOPY WITH PROPOFOL ESOPHAGOGASTRODUODENOSCOPY (EGD) WITH PROPOFOL BIOPSY  Patient Location: Endoscopy Unit  Anesthesia Type:General  Level of Consciousness: drowsy  Airway & Oxygen Therapy: Patient Spontanous Breathing  Post-op Assessment: Report given to RN and Post -op Vital signs reviewed and stable  Post vital signs: Reviewed and stable  Last Vitals:  Vitals Value Taken Time  BP    Temp    Pulse 89   Resp 15   SpO2 100     Last Pain:  Vitals:   05/12/21 0734  TempSrc:   PainSc: 0-No pain      Patients Stated Pain Goal: 2 (35/00/93 8182)  Complications: No notable events documented.

## 2021-05-12 NOTE — H&P (Signed)
Primary Care Physician:  Rosita Fire, MD Primary Gastroenterologist:  Dr. Abbey Chatters  Pre-Procedure History & Physical: HPI:  Kristy Christian is a 60 y.o. female is here for an EGD for GERD, Barrett's and a colonoscopy to be performed for colon cancer screening purposes.  Past Medical History:  Diagnosis Date   GERD (gastroesophageal reflux disease)    IBS (irritable bowel syndrome)    Knee pain    Neurofibromatosis ([place])    UTI (lower urinary tract infection)     Past Surgical History:  Procedure Laterality Date   BREAST LUMPECTOMY  2009   [place] and 1982   CHOLECYSTECTOMY  2007    Prior to Admission medications   Medication Sig Start Date End Date Taking? Authorizing Provider  Cyanocobalamin (VITAMIN B-12) 5000 MCG TBDP Take by mouth daily.   Yes [provider]  Glucosamine-Chondroitin (MOVE FREE PO) Take by mouth 2 (two) times daily.   Yes [provider]  pantoprazole (PROTONIX) 40 MG tablet Take 1 tablet (40 mg total) by mouth daily. Take 30 minutes before breakfast daily 03/05/21  Yes Annitta Needs, NP    Allergies as of 03/05/2021   (No Known Allergies)    Family History  Problem Relation Age of Onset   Arthritis Mother    Cancer Mother        liver   Arthritis Father    Hypertension Father    Stroke Father    Heart attack Father    Other Other        arthralgia, multi sites   Hypertension Other    Cancer Other        breast; aunt   Colon cancer Neg Hx    Colon polyps Neg Hx     Social History   Socioeconomic History   Marital status: Divorced    Spouse name: Not on file   Number of children: Not on file   Years of education: Not on file   Highest education level: Not on file  Occupational History   Not on file  Tobacco Use   Smoking status: Former    Packs/day: 0.50    Years: 39.00    Pack years: 19.50    Types: Cigarettes   Smokeless tobacco: Never   Tobacco comments:    quit around 2017    Vaping Use   Vaping Use: Never used  Substance and Sexual Activity   Alcohol use: Yes    Alcohol/week: 0.0 standard drinks    Comment: occ   Drug use: No    Types: "Crack" cocaine    Comment: none since 1988   Sexual activity: Yes    Birth control/protection: Surgical  Other Topics Concern   Not on file  Social History Narrative   Not on file   Social Determinants of Health   Financial Resource Strain: Not on file  Food Insecurity: Not on file  Transportation Needs: Not on file  Physical Activity: Not on file  Stress: Not on file  Social Connections: Not on file  Intimate Partner Violence: Not on file    Review of Systems: See HPI, otherwise negative ROS  Physical Exam: Vital signs in last 24 hours: Temp:  [98 F (36.7 C)] 98 F (36.7 C) (06/27 0646) Pulse Rate:  [85] 85 (06/27 0646) Resp:  [12] 12 (06/27 0646) BP: (126)/(74) 126/74 (06/27 0646)   General:   Alert,  Well-developed, well-nourished, pleasant and cooperative in NAD Head:  Normocephalic and atraumatic. Eyes:  Sclera clear, no icterus.   Conjunctiva pink. Ears:  Normal auditory acuity. Nose:  No deformity, discharge,  or lesions. Mouth:  No deformity or lesions, dentition normal. Neck:  Supple; no masses or thyromegaly. Lungs:  Clear throughout to auscultation.   No wheezes, crackles, or rhonchi. No acute distress. Heart:  Regular rate and rhythm; no murmurs, clicks, rubs,  or gallops. Abdomen:  Soft, nontender and nondistended. No masses, hepatosplenomegaly or hernias noted. Normal bowel sounds, without guarding, and without rebound.   Msk:  Symmetrical without gross deformities. Normal posture. Extremities:  Without clubbing or edema. Neurologic:  Alert and  oriented x4;  grossly normal neurologically. Skin:  Intact, multiple fibromas over entire body Cervical Nodes:  No significant cervical adenopathy. Psych:  Alert and cooperative. Normal mood and affect.  Impression/Plan: Kristy Christian is  here for an EGD for GERD, Barrett's and a colonoscopy to be performed for colon cancer screening purposes.  The risks of the procedure including infection, bleed, or perforation as well as benefits, limitations, alternatives and imponderables have been reviewed with the patient. Questions have been answered. All parties agreeable.

## 2021-05-12 NOTE — Anesthesia Postprocedure Evaluation (Signed)
Anesthesia Post Note  Patient: Kristy Christian  Procedure(s) Performed: COLONOSCOPY WITH PROPOFOL ESOPHAGOGASTRODUODENOSCOPY (EGD) WITH PROPOFOL BIOPSY  Patient location during evaluation: Endoscopy Anesthesia Type: General Level of consciousness: awake and alert and oriented Pain management: pain level controlled Vital Signs Assessment: post-procedure vital signs reviewed and stable Respiratory status: spontaneous breathing and respiratory function stable Cardiovascular status: blood pressure returned to baseline and stable Postop Assessment: no apparent nausea or vomiting Anesthetic complications: no   No notable events documented.   Last Vitals:  Vitals:   05/12/21 0646 05/12/21 0807  BP: 126/74 112/63  Pulse: 85 88  Resp: 12 17  Temp: 36.7 C 36.4 C  SpO2:  100%    Last Pain:  Vitals:   05/12/21 0807  TempSrc: Oral  PainSc: 0-No pain                 Zakhi Dupre C Rosemae Mcquown

## 2021-05-12 NOTE — Discharge Instructions (Addendum)
EGD Discharge instructions Please read the instructions outlined below and refer to this sheet in the next few weeks. These discharge instructions provide you with general information on caring for yourself after you leave the hospital. Your doctor may also give you specific instructions. While your treatment has been planned according to the most current medical practices available, unavoidable complications occasionally occur. If you have any problems or questions after discharge, please call your doctor. ACTIVITY You may resume your regular activity but move at a slower pace for the next 24 hours.  Take frequent rest periods for the next 24 hours.  Walking will help expel (get rid of) the air and reduce the bloated feeling in your abdomen.  No driving for 24 hours (because of the anesthesia (medicine) used during the test).  You may shower.  Do not sign any important legal documents or operate any machinery for 24 hours (because of the anesthesia used during the test).  NUTRITION Drink plenty of fluids.  You may resume your normal diet.  Begin with a light meal and progress to your normal diet.  Avoid alcoholic beverages for 24 hours or as instructed by your caregiver.  MEDICATIONS You may resume your normal medications unless your caregiver tells you otherwise.  WHAT YOU CAN EXPECT TODAY You may experience abdominal discomfort such as a feeling of fullness or "gas" pains.  FOLLOW-UP Your doctor will discuss the results of your test with you.  SEEK IMMEDIATE MEDICAL ATTENTION IF ANY OF THE FOLLOWING OCCUR: Excessive nausea (feeling sick to your stomach) and/or vomiting.  Severe abdominal pain and distention (swelling).  Trouble swallowing.  Temperature over 101 F (37.8 C).  Rectal bleeding or vomiting of blood.    Colonoscopy Discharge Instructions  Read the instructions outlined below and refer to this sheet in the next few weeks. These discharge instructions provide you with  general information on caring for yourself after you leave the hospital. Your doctor may also give you specific instructions. While your treatment has been planned according to the most current medical practices available, unavoidable complications occasionally occur.   ACTIVITY You may resume your regular activity, but move at a slower pace for the next 24 hours.  Take frequent rest periods for the next 24 hours.  Walking will help get rid of the air and reduce the bloated feeling in your belly (abdomen).  No driving for 24 hours (because of the medicine (anesthesia) used during the test).   Do not sign any important legal documents or operate any machinery for 24 hours (because of the anesthesia used during the test).  NUTRITION Drink plenty of fluids.  You may resume your normal diet as instructed by your doctor.  Begin with a light meal and progress to your normal diet. Heavy or fried foods are harder to digest and may make you feel sick to your stomach (nauseated).  Avoid alcoholic beverages for 24 hours or as instructed.  MEDICATIONS You may resume your normal medications unless your doctor tells you otherwise.  WHAT YOU CAN EXPECT TODAY Some feelings of bloating in the abdomen.  Passage of more gas than usual.  Spotting of blood in your stool or on the toilet paper.  IF YOU HAD POLYPS REMOVED DURING THE COLONOSCOPY: No aspirin products for 7 days or as instructed.  No alcohol for 7 days or as instructed.  Eat a soft diet for the next 24 hours.  FINDING OUT THE RESULTS OF YOUR TEST Not all test results are available  during your visit. If your test results are not back during the visit, make an appointment with your caregiver to find out the results. Do not assume everything is normal if you have not heard from your caregiver or the medical facility. It is important for you to follow up on all of your test results.  SEEK IMMEDIATE MEDICAL ATTENTION IF: You have more than a spotting of  blood in your stool.  Your belly is swollen (abdominal distention).  You are nauseated or vomiting.  You have a temperature over 101.  You have abdominal pain or discomfort that is severe or gets worse throughout the day.   Your EGD revealed findings consistent with Barrett's esophagus.  I took numerous biopsies.  He also have a small hiatal hernia.  Continue on daily Protonix.  Await pathology results, my office will contact you.  We will plan a repeat EGD in 3 years.  Your colonoscopy was relatively unremarkable.  I did not find any polyps or evidence of colon cancer.  I recommend repeating colonoscopy in 10 years for colon cancer screening purposes.    Follow up with GI in 3-4 months to discuss constipation.   I hope you have a great rest of your week!  Elon Alas. Abbey Chatters, D.O. Gastroenterology and Hepatology [facility]

## 2021-05-12 NOTE — Op Note (Signed)
[facility] Patient Name: Kristy Christian Procedure Date: 05/12/2021 7:19 AM MRN: [id number] Date of Birth: 23-Dec-1960 Attending MD: Elon Alas. Abbey Chatters DO CSN: [id number] Age: 60 Admit Type: Outpatient Procedure:                Upper GI endoscopy Indications:              Heartburn, Barrett's esophagus Providers:                Elon Alas. Abbey Chatters, DO, Caprice Kluver, Raphael Gibney,                            Technician Referring MD:              Medicines:                See the Anesthesia note for documentation of the                            administered medications Complications:            No immediate complications. Estimated Blood Loss:     Estimated blood loss was minimal. Procedure:                Pre-Anesthesia Assessment:                           - The anesthesia plan was to use monitored                            anesthesia care (MAC).                           After obtaining informed consent, the endoscope was                            passed under direct vision. Throughout the                            procedure, the patient's blood pressure, pulse, and                            oxygen saturations were monitored continuously. The                            GIF-H190 ([id number]) was introduced through the                            mouth, and advanced to the second part of duodenum.                            The upper GI endoscopy was accomplished without                            difficulty. The patient tolerated the procedure                            well. Scope In: 7:38:03 AM Scope Out:  7:45:53 AM Total Procedure Duration: 0 hours 7 minutes 50 seconds  Findings:      The esophagus and gastroesophageal junction were examined with white       light and narrow band imaging (NBI) from a forward view and retroflexed       position. There were esophageal mucosal changes secondary to established       long-segment Barrett's disease. These changes involved the mucosa  at the       upper extent of the gastric folds (38 cm from the incisors) extending to       the Z-line (34 cm from the incisors). Circumferential salmon-colored       mucosa was present and tongues of salmon-colored mucosa were present.       The maximum longitudinal extent of these esophageal mucosal changes was       4 cm in length. Mucosa was biopsied with a cold forceps for histology.       One specimen bottle was sent to pathology.      Localized mild inflammation characterized by erythema was found in the       gastric antrum.      The duodenal bulb, first portion of the duodenum and second portion of       the duodenum were normal.      A small hiatal hernia was present. Impression:               - Esophageal mucosal changes secondary to                            established long-segment Barrett's disease.                            Biopsied.                           - Gastritis.                           - Normal duodenal bulb, first portion of the                            duodenum and second portion of the duodenum.                           - Small hiatal hernia. Moderate Sedation:      Per Anesthesia Care Recommendation:           - Patient has a contact number available for                            emergencies. The signs and symptoms of potential                            delayed complications were discussed with the                            patient. Return to normal activities tomorrow.                            Written discharge  instructions were provided to the                            patient.                           - Resume previous diet.                           - Continue present medications.                           - Await pathology results.                           - Repeat upper endoscopy in 3 years for                            surveillance.                           - Use a proton pump inhibitor PO daily.                           - Return  to GI clinic in 3 months. Procedure Code(s):        --- Professional ---                           [phone], Esophagogastroduodenoscopy, flexible,                            transoral; with biopsy, single or multiple Diagnosis Code(s):        --- Professional ---                           K22.70, Barrett's esophagus without dysplasia                           K29.70, Gastritis, unspecified, without bleeding                           R12, Heartburn CPT copyright 2019 American Medical Association. All rights reserved. The codes documented in this report are preliminary and upon coder review may  be revised to meet current compliance requirements. Elon Alas. Abbey Chatters, DO [place] Abbey Chatters, DO 05/12/2021 7:49:16 AM This report has been signed electronically. Number of Addenda: 0

## 2021-05-12 NOTE — Anesthesia Preprocedure Evaluation (Addendum)
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, snoring Dental  (+) Edentulous Upper, Edentulous Lower   Pulmonary former smoker,    Pulmonary exam normal breath sounds clear to auscultation       Cardiovascular Exercise Tolerance: Good Normal cardiovascular exam Rhythm:Regular Rate:Normal     Neuro/Psych negative neurological ROS  negative psych ROS   GI/Hepatic Neg liver ROS, GERD  Medicated,  Endo/Other  negative endocrine ROS  Renal/GU negative Renal ROS     Musculoskeletal   Abdominal   Peds  Hematology   Anesthesia Other Findings Neurofibromatosis   Reproductive/Obstetrics                            Anesthesia Physical Anesthesia Plan  ASA: 2  Anesthesia Plan: General   Post-op Pain Management:    Induction: Intravenous  PONV Risk Score and Plan: Propofol infusion  Airway Management Planned: Nasal Cannula and Natural Airway  Additional Equipment:   Intra-op Plan:   Post-operative Plan:   Informed Consent: I have reviewed the patients History and Physical, chart, labs and discussed the procedure including the risks, benefits and alternatives for the proposed anesthesia with the patient or authorized representative who has indicated his/her understanding and acceptance.       Plan Discussed with: CRNA and Surgeon  Anesthesia Plan Comments:        Anesthesia Quick Evaluation

## 2021-05-13 NOTE — Progress Notes (Signed)
Results mailed 

## 2021-05-14 LAB — SURGICAL PATHOLOGY

## 2021-05-16 ENCOUNTER — Encounter (HOSPITAL_COMMUNITY): Payer: Self-pay | Admitting: Internal Medicine

## 2021-06-04 ENCOUNTER — Other Ambulatory Visit: Payer: Self-pay

## 2021-06-04 ENCOUNTER — Ambulatory Visit (INDEPENDENT_AMBULATORY_CARE_PROVIDER_SITE_OTHER): Payer: Medicare Other | Admitting: Urology

## 2021-06-04 VITALS — BP 100/64 | HR 85 | Temp 98.3°F

## 2021-06-04 DIAGNOSIS — N390 Urinary tract infection, site not specified: Secondary | ICD-10-CM | POA: Diagnosis not present

## 2021-06-04 NOTE — Progress Notes (Signed)
Urological Symptom Review  Patient is experiencing the following symptoms: Urinary tract infection   Review of Systems  Gastrointestinal (upper)  : Indigestion/heartburn  Gastrointestinal (lower) : Diarrhea Constipation  Constitutional : Negative for symptoms  Skin: Negative for skin symptoms  Eyes: Negative for eye symptoms  Ear/Nose/Throat : Negative for Ear/Nose/Throat symptoms  Hematologic/Lymphatic: Negative for Hematologic/Lymphatic symptoms  Cardiovascular : Negative for cardiovascular symptoms  Respiratory : Negative for respiratory symptoms  Endocrine: Negative for endocrine symptoms  Musculoskeletal: Negative for musculoskeletal symptoms  Neurological: Headaches  Psychologic: Negative for psychiatric symptoms

## 2021-06-04 NOTE — Progress Notes (Signed)
06/04/2021 9:16 AM   Kiari Theda Sers December 12, 1960 [id number]  Referring provider: Rosita Fire, MD [place],  [place] [address]  Followup recurrent UTI   HPI: Ms Kristy Christian is a 60yo here for followup for recurrent UTI. She underwent MRI 2 weeks ago but the images are not available. She does not have a disc with the MRI images. She denies any UTIs since last visit. No complaints today   PMH: Past Medical History:  Diagnosis Date   GERD (gastroesophageal reflux disease)    IBS (irritable bowel syndrome)    Knee pain    Neurofibromatosis ([place])    UTI (lower urinary tract infection)     Surgical History: Past Surgical History:  Procedure Laterality Date   BIOPSY  05/12/2021   Procedure: BIOPSY;  Surgeon: Eloise Harman, DO;  [facility];  Service: Endoscopy;;   BREAST LUMPECTOMY  2009   [employer]  2007   COLONOSCOPY WITH PROPOFOL N/A 05/12/2021   Procedure: COLONOSCOPY WITH PROPOFOL;  Surgeon: Eloise Harman, DO;  [facility];  Service: Endoscopy;  Laterality: N/A;  2:45pm   ESOPHAGOGASTRODUODENOSCOPY (EGD) WITH PROPOFOL N/A 05/12/2021   Procedure: ESOPHAGOGASTRODUODENOSCOPY (EGD) WITH PROPOFOL;  Surgeon: Eloise Harman, DO;  [facility];  Service: Endoscopy;  Laterality: N/A;    Home Medications:  Allergies as of 06/04/2021   No Known Allergies      Medication List        Accurate as of June 04, 2021  9:16 AM. If you have any questions, ask your nurse or doctor.          MOVE FREE PO Take by mouth 2 (two) times daily.   pantoprazole 40 MG tablet Commonly known as: PROTONIX Take 1 tablet (40 mg total) by mouth daily. Take 30 minutes before breakfast daily   Vitamin B-12 5000 MCG Tbdp Take by mouth daily.        Allergies: No Known Allergies  Family History: Family History  Problem Relation Age of Onset   Arthritis Mother     Cancer Mother        liver   Arthritis Father    Hypertension Father    Stroke Father    Heart attack Father    Other Other        arthralgia, multi sites   Hypertension Other    Cancer Other        breast; aunt   Colon cancer Neg Hx    Colon polyps Neg Hx     Social History:  reports that she has quit smoking. Her smoking use included cigarettes. She has a 19.50 pack-year smoking history. She has never used smokeless tobacco. She reports current alcohol use. She reports that she does not use drugs.  ROS: All other review of systems were reviewed and are negative except what is noted above in HPI  Physical Exam: BP 100/64   Pulse 85   Temp 98.3 F (36.8 C) (Oral)   Constitutional:  Alert and oriented, No acute distress. HEENT: [place] AT, moist mucus membranes.  Trachea midline, no masses. Cardiovascular: No clubbing, cyanosis, or edema. Respiratory: Normal respiratory effort, no increased work of breathing. GI: Abdomen is soft, nontender, nondistended, no abdominal masses GU: No CVA tenderness.  Lymph: No cervical or inguinal lymphadenopathy. Skin: No rashes, bruises or suspicious lesions. Neurologic: Grossly intact, no focal deficits, moving all 4  extremities. Psychiatric: Normal mood and affect.  Laboratory Data: Lab Results  Component Value Date   WBC 9.3 09/22/2017   HGB 13.7 09/22/2017   HCT 39.8 09/22/2017   MCV 81.9 09/22/2017   PLT 218 09/22/2017    Lab Results  Component Value Date   CREATININE 0.77 09/22/2017    No results found for: PSA  No results found for: TESTOSTERONE  No results found for: HGBA1C  Urinalysis    Component Value Date/Time   COLORURINE AMBER (A) 08/21/2017 1801   APPEARANCEUR Hazy (A) 05/02/2021 1129   LABSPEC 1.027 08/21/2017 1801   PHURINE 5.0 08/21/2017 1801   GLUCOSEU Negative 05/02/2021 1129   HGBUR SMALL (A) 08/21/2017 1801   BILIRUBINUR Negative 05/02/2021 1129   KETONESUR 5 (A) 08/21/2017 1801   PROTEINUR Negative  05/02/2021 1129   PROTEINUR 30 (A) 08/21/2017 1801   UROBILINOGEN 0.2 04/05/2017 1401   UROBILINOGEN 0.2 08/26/2015 1100   NITRITE Negative 05/02/2021 1129   NITRITE NEGATIVE 08/21/2017 1801   LEUKOCYTESUR 1+ (A) 05/02/2021 1129    Lab Results  Component Value Date   LABMICR See below: 05/02/2021   WBCUA 0-5 05/02/2021   LABEPIT 0-10 05/02/2021   BACTERIA None seen 05/02/2021    Pertinent Imaging:  No results found for this or any previous visit.  No results found for this or any previous visit.  No results found for this or any previous visit.  No results found for this or any previous visit.  No results found for this or any previous visit.  No results found for this or any previous visit.  No results found for this or any previous visit.  No results found for this or any previous visit.   Assessment & Plan:    1. Recurrent UTI We will request MRI from [facility] prior to initiating therapy.  - Urinalysis, Routine w reflex microscopic   No follow-ups on file.  Nicolette Bang, MD  [facility] [place]

## 2021-06-14 ENCOUNTER — Encounter: Payer: Self-pay | Admitting: Urology

## 2021-06-14 NOTE — Patient Instructions (Signed)

## 2021-07-11 ENCOUNTER — Ambulatory Visit: Payer: Medicare Other | Admitting: Urology

## 2021-07-11 DIAGNOSIS — N390 Urinary tract infection, site not specified: Secondary | ICD-10-CM

## 2021-09-03 ENCOUNTER — Encounter: Payer: Self-pay | Admitting: Internal Medicine

## 2021-09-03 ENCOUNTER — Ambulatory Visit: Payer: Medicare Other | Admitting: Gastroenterology

## 2021-11-03 DIAGNOSIS — D3111 Benign neoplasm of right cornea: Secondary | ICD-10-CM | POA: Insufficient documentation

## 2021-11-21 ENCOUNTER — Ambulatory Visit (INDEPENDENT_AMBULATORY_CARE_PROVIDER_SITE_OTHER): Payer: Medicare Other | Admitting: Urology

## 2021-11-21 ENCOUNTER — Encounter: Payer: Self-pay | Admitting: Urology

## 2021-11-21 ENCOUNTER — Other Ambulatory Visit: Payer: Self-pay

## 2021-11-21 VITALS — BP 127/67 | HR 88 | Ht 65.0 in | Wt 185.0 lb

## 2021-11-21 DIAGNOSIS — N390 Urinary tract infection, site not specified: Secondary | ICD-10-CM | POA: Diagnosis not present

## 2021-11-21 LAB — BLADDER SCAN AMB NON-IMAGING: Scan Result: >17

## 2021-11-21 LAB — URINALYSIS, ROUTINE W REFLEX MICROSCOPIC
Bilirubin, UA: NEGATIVE
Glucose, UA: NEGATIVE
Ketones, UA: NEGATIVE
Nitrite, UA: NEGATIVE
Protein,UA: NEGATIVE
Specific Gravity, UA: >1.03 — ABNORMAL HIGH (ref 1.005–1.030)
Urobilinogen, Ur: 0.2 mg/dL (ref 0.2–1.0)
pH, UA: 5.5 (ref 5.0–7.5)

## 2021-11-21 LAB — MICROSCOPIC EXAMINATION
Epithelial Cells (non renal): >10 /hpf — AB (ref 0–10)
Renal Epithel, UA: NONE SEEN /hpf
WBC, UA: >30 /hpf — AB (ref 0–5)

## 2021-11-21 MED ORDER — NITROFURANTOIN MACROCRYSTAL 50 MG PO CAPS: 50.0000 mg | ORAL_CAPSULE | Freq: Every day | ORAL | 11 refills | Status: DC

## 2021-11-21 NOTE — Patient Instructions (Signed)

## 2021-11-21 NOTE — Progress Notes (Signed)
post void residual = >17 Urological Symptom Review  Patient is experiencing the following symptoms: Urinary tract infection   Review of Systems  Gastrointestinal (upper)  : Negative for upper GI symptoms  Gastrointestinal (lower) : Diarrhea Constipation  Constitutional : Fatigue  Skin: Negative for skin symptoms  Eyes: Negative for eye symptoms  Ear/Nose/Throat : Sinus problems  Hematologic/Lymphatic: Negative for Hematologic/Lymphatic symptoms  Cardiovascular : Negative for cardiovascular symptoms  Respiratory : Negative for respiratory symptoms  Endocrine: Negative for endocrine symptoms  Musculoskeletal: Joint pain  Neurological: Negative for neurological symptoms  Psychologic: Negative for psychiatric symptoms

## 2021-11-21 NOTE — Progress Notes (Signed)
11/21/2021 11:55 AM   Kristy Christian 01-Aug-1961 [id number]  Referring provider: Rosita Fire, MD [place],  [place] [id number]  Followup recurrent UTI   HPI: Kristy Christian is a 61yo here for followup for recurrent UTI. She has had 3 UTIs since last visit. [facility] from 07/2021 shows no GU abnormalities. She denies any significant LUTS. NO hematuria or dysuria.    PMH: Past Medical History:  Diagnosis Date   GERD (gastroesophageal reflux disease)    IBS (irritable bowel syndrome)    Knee pain    Neurofibromatosis ([place])    UTI (lower urinary tract infection)     Surgical History: Past Surgical History:  Procedure Laterality Date   BIOPSY  05/12/2021   Procedure: BIOPSY;  Surgeon: Eloise Harman, DO;  [facility];  Service: Endoscopy;;   BREAST LUMPECTOMY  2009   [place]  2007   COLONOSCOPY WITH PROPOFOL N/A 05/12/2021   Procedure: COLONOSCOPY WITH PROPOFOL;  Surgeon: Eloise Harman, DO;  [facility];  Service: Endoscopy;  Laterality: N/A;  2:45pm   ESOPHAGOGASTRODUODENOSCOPY (EGD) WITH PROPOFOL N/A 05/12/2021   Procedure: ESOPHAGOGASTRODUODENOSCOPY (EGD) WITH PROPOFOL;  Surgeon: Eloise Harman, DO;  [facility];  Service: Endoscopy;  Laterality: N/A;    Home Medications:  Allergies as of 11/21/2021   No Known Allergies      Medication List        Accurate as of November 21, 2021 11:55 AM. If you have any questions, ask your nurse or doctor.          STOP taking these medications    ciprofloxacin 250 MG tablet Commonly known as: CIPRO Stopped by: Nicolette Bang, MD       TAKE these medications    MOVE FREE PO Take by mouth 2 (two) times daily.   pantoprazole 40 MG tablet Commonly known as: PROTONIX Take 1 tablet (40 mg total) by mouth daily. Take 30 minutes before breakfast daily   Vitamin B-12 5000 MCG Tbdp Take by mouth  daily.        Allergies: No Known Allergies  Family History: Family History  Problem Relation Age of Onset   Arthritis Mother    Cancer Mother        liver   Arthritis Father    Hypertension Father    Stroke Father    Heart attack Father    Other Other        arthralgia, multi sites   Hypertension Other    Cancer Other        breast; aunt   Colon cancer Neg Hx    Colon polyps Neg Hx     Social History:  reports that she has quit smoking. Her smoking use included cigarettes. She has a 19.50 pack-year smoking history. She has never used smokeless tobacco. She reports current alcohol use. She reports that she does not use drugs.  ROS: All other review of systems were reviewed and are negative except what is noted above in HPI  Physical Exam: BP 127/67    Pulse 88    Ht 5\' 5"  (1.651 m)    Wt 185 lb (83.9 kg)    BMI 30.79 kg/m   Constitutional:  Alert and oriented, No acute distress. HEENT: [place] AT, moist mucus membranes.  Trachea midline, no masses. Cardiovascular: No clubbing, cyanosis, or edema. Respiratory: Normal respiratory effort,  no increased work of breathing. GI: Abdomen is soft, nontender, nondistended, no abdominal masses GU: No CVA tenderness.  Lymph: No cervical or inguinal lymphadenopathy. Skin: No rashes, bruises or suspicious lesions. Neurologic: Grossly intact, no focal deficits, moving all 4 extremities. Psychiatric: Normal mood and affect.  Laboratory Data: Lab Results  Component Value Date   WBC 9.3 09/22/2017   HGB 13.7 09/22/2017   HCT 39.8 09/22/2017   MCV 81.9 09/22/2017   PLT 218 09/22/2017    Lab Results  Component Value Date   CREATININE 0.77 09/22/2017    No results found for: PSA  No results found for: TESTOSTERONE  No results found for: HGBA1C  Urinalysis    Component Value Date/Time   COLORURINE AMBER (A) 08/21/2017 1801   APPEARANCEUR Hazy (A) 05/02/2021 1129   LABSPEC 1.027 08/21/2017 1801   PHURINE 5.0 08/21/2017  1801   GLUCOSEU Negative 05/02/2021 1129   HGBUR SMALL (A) 08/21/2017 1801   BILIRUBINUR Negative 05/02/2021 1129   KETONESUR 5 (A) 08/21/2017 1801   PROTEINUR Negative 05/02/2021 1129   PROTEINUR 30 (A) 08/21/2017 1801   UROBILINOGEN 0.2 04/05/2017 1401   UROBILINOGEN 0.2 08/26/2015 1100   NITRITE Negative 05/02/2021 1129   NITRITE NEGATIVE 08/21/2017 1801   LEUKOCYTESUR 1+ (A) 05/02/2021 1129    Lab Results  Component Value Date   LABMICR See below: 05/02/2021   WBCUA 0-5 05/02/2021   LABEPIT 0-10 05/02/2021   BACTERIA None seen 05/02/2021    Pertinent Imaging: MRI 07/2021: Images reviewed and discussed with the patient  No results found for this or any previous visit.  No results found for this or any previous visit.  No results found for this or any previous visit.  No results found for this or any previous visit.  No results found for this or any previous visit.  No results found for this or any previous visit.  No results found for this or any previous visit.  No results found for this or any previous visit.   Assessment & Plan:    1. Recurrent UTI -We discussed the natural hx of recurrent UTIs and the various causes. We discussed the treatment options including post coital prophylaxis, daily prophylaxis, topical estrogen therapy. We will start macrobid 50mg  qhs - Urinalysis, Routine w reflex microscopic - BLADDER SCAN AMB NON-IMAGING   No follow-ups on file.  Nicolette Bang, MD  [facility] [place]

## 2021-11-26 ENCOUNTER — Other Ambulatory Visit (HOSPITAL_COMMUNITY): Payer: Self-pay | Admitting: Gerontology

## 2021-11-26 DIAGNOSIS — Z780 Asymptomatic menopausal state: Secondary | ICD-10-CM

## 2021-12-11 ENCOUNTER — Other Ambulatory Visit (HOSPITAL_COMMUNITY): Payer: Medicare Other

## 2021-12-24 DIAGNOSIS — H1189 Other specified disorders of conjunctiva: Secondary | ICD-10-CM | POA: Diagnosis not present

## 2021-12-24 DIAGNOSIS — D3111 Benign neoplasm of right cornea: Secondary | ICD-10-CM | POA: Diagnosis not present

## 2021-12-24 DIAGNOSIS — D3611 Benign neoplasm of peripheral nerves and autonomic nervous system of face, head, and neck: Secondary | ICD-10-CM | POA: Diagnosis not present

## 2021-12-29 IMAGING — MG MM DIGITAL SCREENING BILAT W/ TOMO AND CAD
6 of 10 series · 6 of 30 positions shown · non-contrast
Comparison: Previous exam(s).

CLINICAL DATA: Screening.

EXAM:
DIGITAL SCREENING BILATERAL MAMMOGRAM WITH TOMOSYNTHESIS AND CAD
TECHNIQUE: Bilateral screening digital craniocaudal and mediolateral oblique
mammograms were obtained. Bilateral screening digital breast
tomosynthesis was performed. The images were evaluated with
computer-aided detection.

[R MLO synth-2D (1 of 2)]
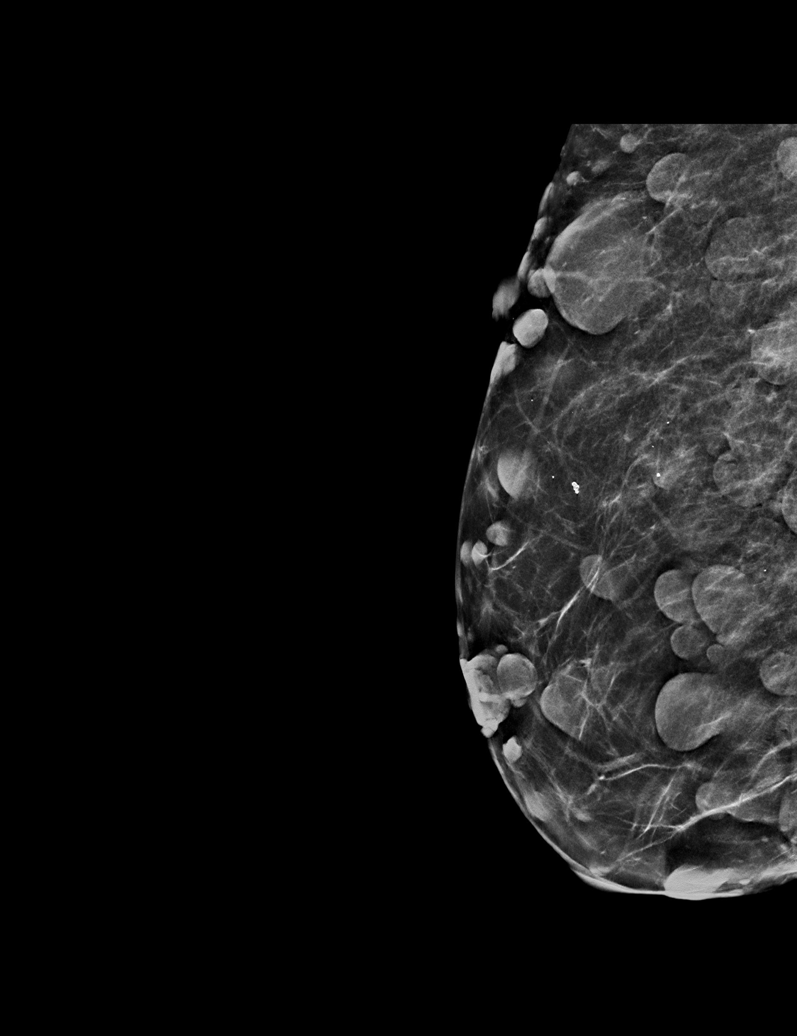

[L MLO synth-2D]
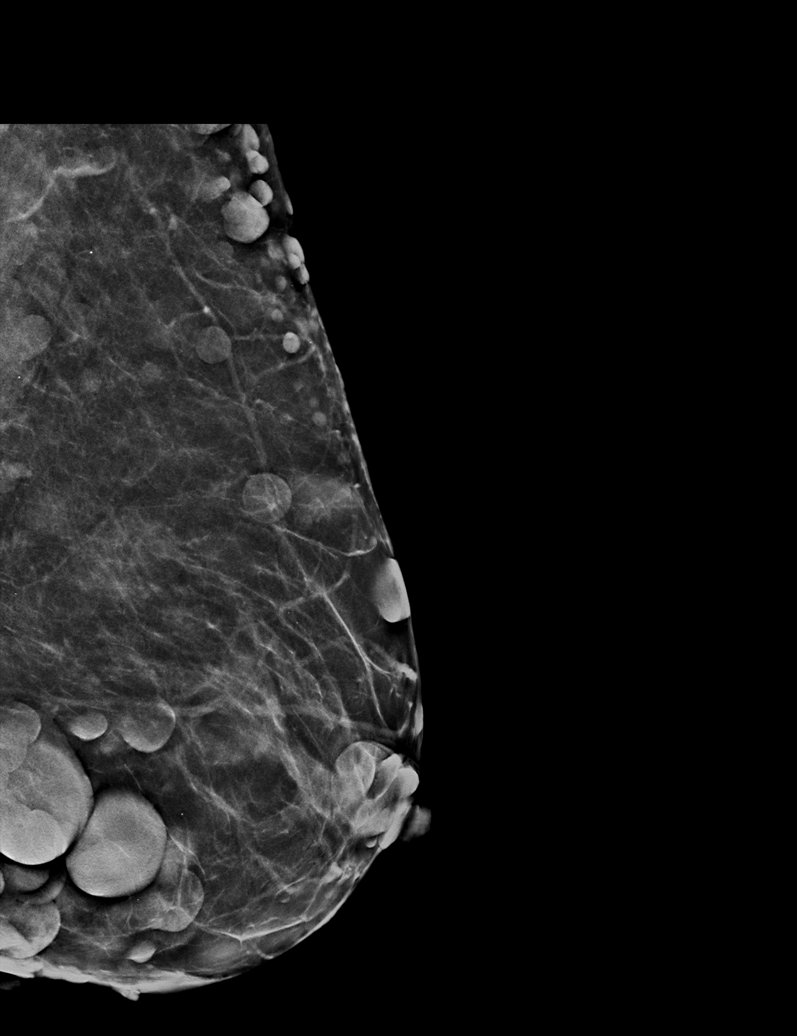

[L CC synth-2D]
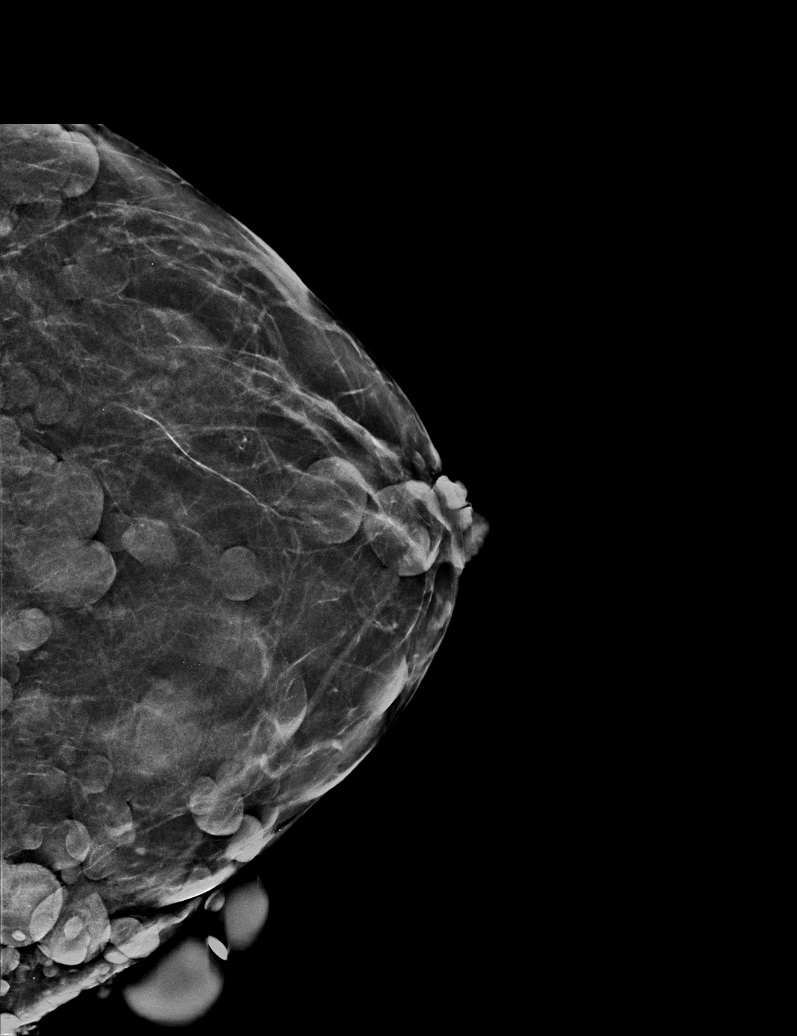

[R MLO synth-2D (2 of 2)]
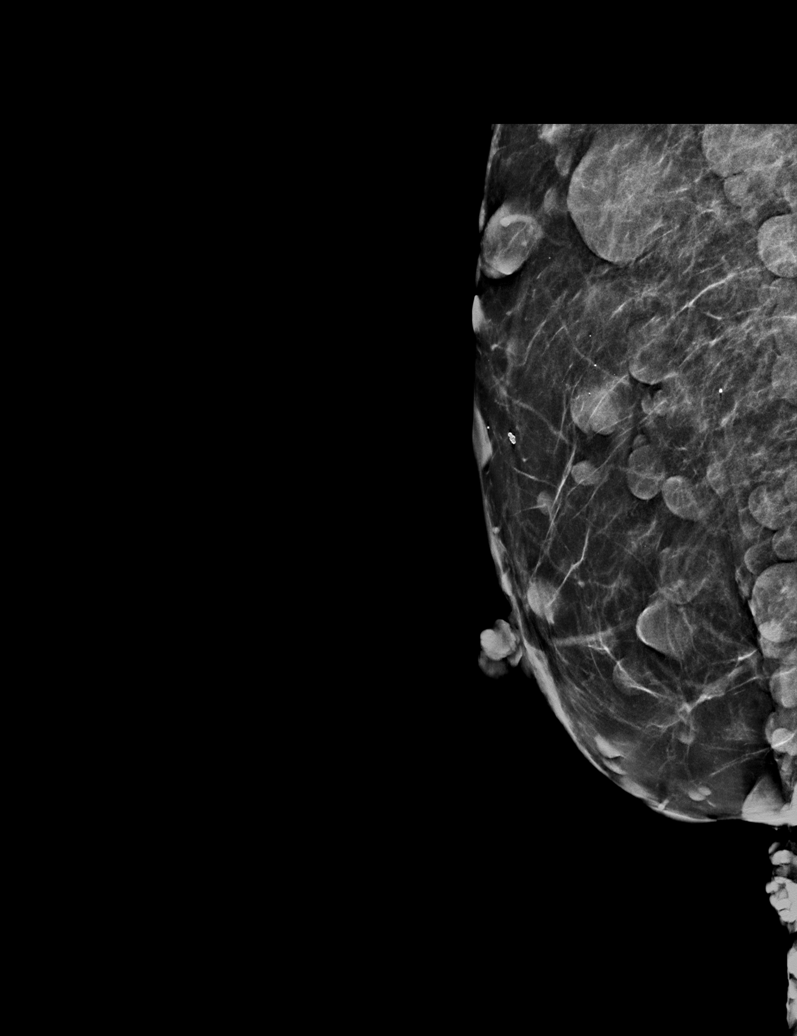

[R CC synth-2D]
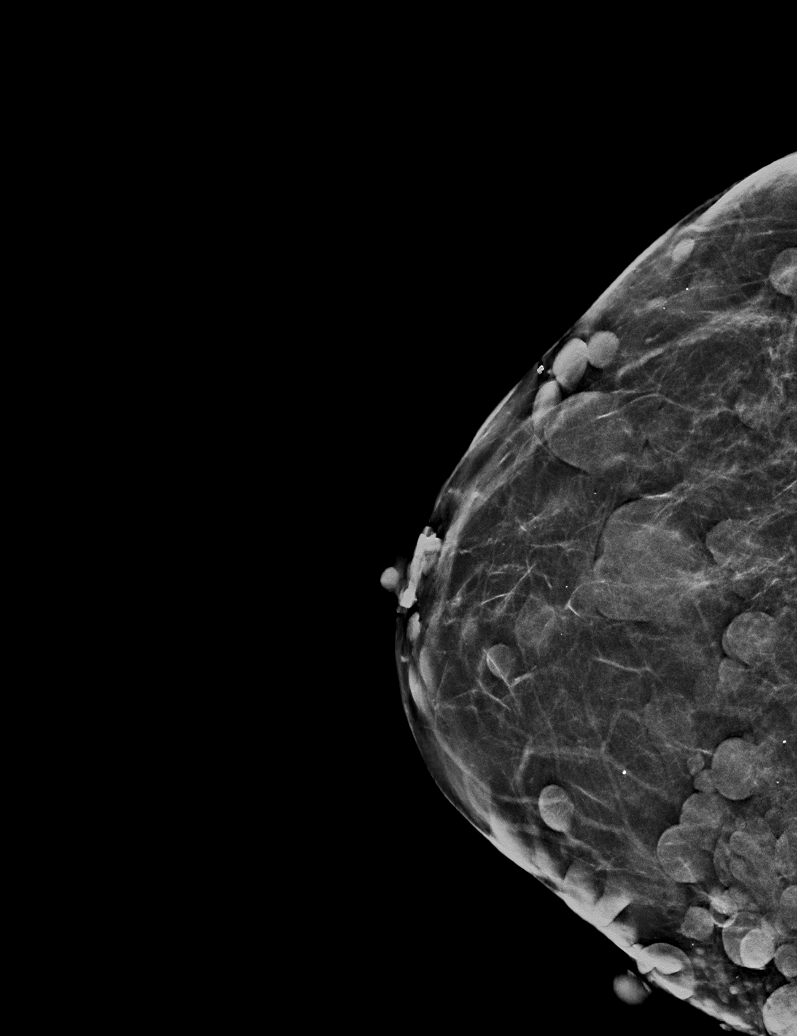

[R MLO tomo · tomo slice 27/53.0]
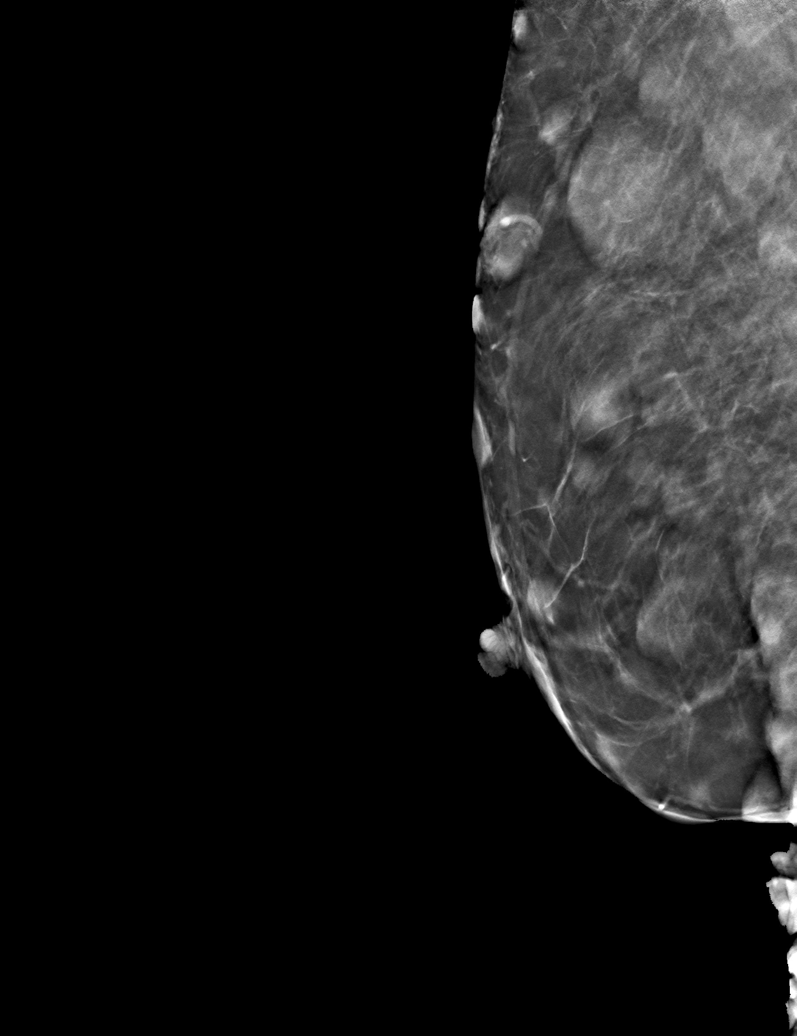

[6 of 30 positions shown; findings below may reference images not displayed]

ACR Breast Density Category b: There are scattered areas of
fibroglandular density.
FINDINGS: There are no findings suspicious for malignancy. The images were
evaluated with computer-aided detection.
IMPRESSION: No mammographic evidence of malignancy. A result letter of this
screening mammogram will be mailed directly to the patient.

RECOMMENDATION:
Screening mammogram in one year. (Code:WJ-I-BG6)

BI-RADS CATEGORY  1: Negative.

## 2022-01-23 DIAGNOSIS — R350 Frequency of micturition: Secondary | ICD-10-CM | POA: Diagnosis not present

## 2022-02-11 DIAGNOSIS — R350 Frequency of micturition: Secondary | ICD-10-CM | POA: Diagnosis not present

## 2022-02-11 DIAGNOSIS — N390 Urinary tract infection, site not specified: Secondary | ICD-10-CM | POA: Diagnosis not present

## 2022-02-18 ENCOUNTER — Ambulatory Visit: Payer: Medicare Other | Admitting: Urology

## 2022-02-23 ENCOUNTER — Ambulatory Visit: Payer: Medicare Other | Admitting: Urology

## 2022-03-23 DIAGNOSIS — R197 Diarrhea, unspecified: Secondary | ICD-10-CM | POA: Diagnosis not present

## 2022-03-23 DIAGNOSIS — R1110 Vomiting, unspecified: Secondary | ICD-10-CM | POA: Diagnosis not present

## 2022-03-23 DIAGNOSIS — R82998 Other abnormal findings in urine: Secondary | ICD-10-CM | POA: Diagnosis not present

## 2022-04-07 ENCOUNTER — Ambulatory Visit (HOSPITAL_COMMUNITY)
Admission: RE | Admit: 2022-04-07 | Discharge: 2022-04-07 | Disposition: A | Discharge status: Home / Self Care | Payer: Medicare HMO | Source: Ambulatory Visit | Attending: Gerontology | Admitting: Gerontology

## 2022-04-07 DIAGNOSIS — Z780 Asymptomatic menopausal state: Secondary | ICD-10-CM | POA: Insufficient documentation

## 2022-04-07 DIAGNOSIS — M8589 Other specified disorders of bone density and structure, multiple sites: Secondary | ICD-10-CM | POA: Diagnosis not present

## 2022-04-20 ENCOUNTER — Telehealth: Payer: Self-pay | Admitting: Gastroenterology

## 2022-04-20 ENCOUNTER — Encounter: Payer: Self-pay | Admitting: Gastroenterology

## 2022-04-20 ENCOUNTER — Ambulatory Visit (INDEPENDENT_AMBULATORY_CARE_PROVIDER_SITE_OTHER): Payer: Medicare HMO | Admitting: Gastroenterology

## 2022-04-20 DIAGNOSIS — R198 Other specified symptoms and signs involving the digestive system and abdomen: Secondary | ICD-10-CM | POA: Diagnosis not present

## 2022-04-20 DIAGNOSIS — R208 Other disturbances of skin sensation: Secondary | ICD-10-CM

## 2022-04-20 DIAGNOSIS — Q8500 Neurofibromatosis, unspecified: Secondary | ICD-10-CM

## 2022-04-20 DIAGNOSIS — K2270 Barrett's esophagus without dysplasia: Secondary | ICD-10-CM | POA: Insufficient documentation

## 2022-04-20 DIAGNOSIS — Z8719 Personal history of other diseases of the digestive system: Secondary | ICD-10-CM

## 2022-04-20 MED ORDER — PANTOPRAZOLE SODIUM 40 MG PO TBEC: 40.0000 mg | DELAYED_RELEASE_TABLET | Freq: Every day | ORAL | 3 refills | Status: AC

## 2022-04-20 NOTE — Telephone Encounter (Signed)
Pt was made aware and verbalized understanding.  

## 2022-04-20 NOTE — Telephone Encounter (Signed)
Kristy Christian, Can we request copy of whole body MRI done at [facility] 2022?  I cannot see it in [place].   [name], I have reviewed patient's CT from [facility] 2021. She has history of recurrent small bowel obstructions in 2013, 2014, 2021 which makes her no longer a candidate for small bowel capsule. We will need to look at her small bowel with another imaging modality but I first want to review her whole body MRI. Please let her know. Thanks!

## 2022-04-20 NOTE — Progress Notes (Signed)
GI Office Note    Referring Provider: Carrolyn Meiers* Primary Care Physician:  Carrolyn Meiers, MD  Primary Gastroenterologist: Elon Alas. Abbey Chatters, DO   Chief Complaint   Chief Complaint  Patient presents with   Abdominal Pain    Burning in middle/lower part of stomach no matter what she eats. She is also having issues with her bowels. States that sometimes she will have constipation and sometimes she will have diarrhea. Had diarrhea for four days. Did not have a bm yesterday and has not had one so far today.     History of Present Illness   Kristy Christian is a 61 y.o. female presenting today for follow-up.  Last seen April 2022.  Self-reported history of Barrett's.  Intermittent GERD.  History of alternating constipation and diarrhea. She also has neurofibromatosis.  Patient is followed by hematology/oncology for her neurofibromatosis yearly for whole body NF screening. She is due later this month.   Since her last office visit she completed EGD and colonoscopy as outlined below.  She continues to alternate between constipation and diarrhea, about 50% each.  The day she has diarrhea she gets 4-5 times per day.  Denies any melena or rectal bleeding.  She has to take Imodium sometimes at work if she is having diarrhea because she is unable to go to the restroom when needed.  Typically only takes 1 Imodium equivalent.  Sometimes this will cause constipation but not usually.  She complains of intermittent lower abdominal discomfort, described as burning quality.  She has tried fiber in the past but never really consistent with it.  No previous IBS meds to her knowledge.  She would like a refill on pantoprazole.  She has been purchasing her over-the-counter acid reflux medications when needed.  Only occasionally has heartburn unless she strays from her diet.  Denies dysphagia.    No family history of colon cancer or personal history of colon polyps.     Colonoscopy June  2022: Nonbleeding hemorrhoids.  Next colonoscopy in 10 years.  EGD June 2022: Barrett's esophagus with no dysplasia, small hiatal hernia, gastritis no H. pylori.  Next EGD in 3 years.  CT abdomen pelvis with contrast June 2021: Small bowel obstruction with transition point in the TI similar to 2014 in 2013 studies.  Medications   Current Outpatient Medications  Medication Sig Dispense Refill   Cyanocobalamin (VITAMIN B-12) 5000 MCG TBDP Take by mouth daily.     Glucosamine-Chondroitin (MOVE FREE PO) Take by mouth 2 (two) times daily.     No current facility-administered medications for this visit.    Allergies   Allergies as of 04/20/2022   (No Known Allergies)       Review of Systems   General: Negative for anorexia, weight loss, fever, chills, fatigue, weakness. ENT: Negative for hoarseness, difficulty swallowing , nasal congestion. CV: Negative for chest pain, angina, palpitations, dyspnea on exertion, peripheral edema.  Respiratory: Negative for dyspnea at rest, dyspnea on exertion, cough, sputum, wheezing.  GI: See history of present illness. GU:  Negative for dysuria, hematuria, urinary incontinence, urinary frequency, nocturnal urination.  Endo: Negative for unusual weight change.     Physical Exam   BP 138/90 (BP Location: Right Arm, Patient Position: Sitting, Cuff Size: Normal)   Pulse 96   Temp (!) 97.3 F (36.3 C) (Temporal)   Ht '5\' 5"'$  (1.651 m)   Wt 190 lb 9.6 oz (86.5 kg)   SpO2 100%   BMI 31.72 kg/m  General: Well-nourished, well-developed in no acute distress.  Eyes: No icterus. Mouth: Oropharyngeal mucosa moist and pink , no lesions erythema or exudate. Lungs: Clear to auscultation bilaterally.  Heart: Regular rate and rhythm, no murmurs rubs or gallops.  Abdomen: Bowel sounds are normal, nontender, nondistended, no hepatosplenomegaly or masses,  no abdominal bruits or hernia , no rebound or guarding.  Rectal: not performed Extremities: No lower  extremity edema. No clubbing or deformities. Neuro: Alert and oriented x 4   Skin: Warm and dry, no jaundice. TNTC neurofibromas diffusely seen on face, abdomen, arms Psych: Alert and cooperative, normal mood and affect.  Labs   None available.   Imaging Studies   DG BONE DENSITY (DXA)  Result Date: 04/07/2022 EXAM: DUAL X-RAY ABSORPTIOMETRY (DXA) FOR BONE MINERAL DENSITY IMPRESSION: Your patient Kristy Christian completed a BMD test on 04/07/2022 using the Allen (software version: 14.10) manufactured by [employer]. The following summarizes the results of our evaluation. Technologist: [facility] PATIENT BIOGRAPHICAL: Name: Kristy Christian, Kristy Christian Patient ID: [id number] Birth Date: [birth year] Height: 65.0 in. Gender: Female Exam Date: 04/07/2022 Weight: 185.0 lbs. Indications: Caucasian, Low Calcium Intake, Partial Hysterectomy, Post Menopausal Fractures: Treatments: DENSITOMETRY RESULTS: Site      Region     Measured Date Measured Age WHO Classification Young Adult T-score BMD         %Change vs. Previous Significant Change (*) AP Spine L1-L4 (L2) 04/07/2022 61.3 Osteopenia -2.2 0.906 g/cm2 - - DualFemur Neck Left 04/07/2022 61.3 Osteopenia -2.4 0.702 g/cm2 - - DualFemur Total Mean 04/07/2022 61.3 Osteopenia -2.0 0.761 g/cm2 - - ASSESSMENT: The BMD measured at Femur Neck Left is 0.702 g/cm2 with a T-score of -2.4. This patient is considered osteopenic according to [facility]) criteria. The scan quality is good. L2 was excluded due to advanced degenerative changes. World Pharmacologist [facility]) criteria for post-menopausal, Caucasian Women: Normal:       T-score at or above -1 SD Osteopenia:   T-score between -1 and -2.5 SD Osteoporosis: T-score at or below -2.5 SD RECOMMENDATIONS: 1. All patients should optimize calcium and vitamin D intake. 2. Consider FDA-approved medical therapies in postmenopausal women and med aged 61 years and older, based on the following: a. A  hip or vertebral (clinical or morphometric) fracture b. T-score< -2.5 at the femoral neck or spine after appropriate evaluation to exclude secondary causes c. Low bone mass (T-score between -1.0 and -2.5 at the femoral neck or spine) and a 10-year probability of a hip fracture > 3% or a 10-year probability of a major osteoporosis-related fracture > 20% based on the [place]-adapted WHO algorithm d. Clinician judgment and/or patient preferences may indicate treatment for people with 10-year fracture probabilities above or below these levels FOLLOW-UP: Patients with diagnosis of osteoporsis or at high risk for fracture should have regular bone mineral density tests. For patients eligible for Medicare, routine testing is allowed once every 2 years. The testing frequency can be increased to one year for patients who have rapidly progressing disease, those who are receiving or discontinuing medical therapy to restore bone mass, or have additional risk factors. I have reviewed this report, and agree with the above findings. [facility] Your patient Kristy Christian completed a FRAX assessment on 04/07/2022 using the Trezevant (analysis version: 14.10) manufactured by [employer]. The following summarizes the results of our evaluation. PATIENT BIOGRAPHICAL: Name: Kristy Christian, Kristy Christian Patient ID: [id number] Birth Date: 06/11/1961 Height:    65.0 in. Gender:  Female    Age:        61.3       Weight:    185.0 lbs. Ethnicity:  White                            Exam Date: 04/07/2022 FRAX* RESULTS:  (version: 3.5) 10-year Probability of Fracture1 Major Osteoporotic Fracture2 Hip Fracture 11.3% 2.0% Population: [place] (Caucasian) Risk Factors: None Based on Femur (Left) Neck BMD 1 -The 10-year probability of fracture may be lower than reported if the patient has received treatment. 2 -Major Osteoporotic Fracture: Clinical Spine, Forearm, Hip or Shoulder *FRAX is a Materials engineer of the [employer] for Metabolic Bone Disease, a [place] (WHO) [facility]. ASSESSMENT: The probability of a major osteoporotic fracture is 11.3% within the next ten years. The probability of a hip fracture is 2.0% within the next ten years. Electronically Signed   By: Zerita Boers M.D.   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   GERD/Barrett's: currently not on PPI, sometimes takes otc. Would like rx. Overall, controls reflux symptoms with diet.   Alternating constipation/diarrhea. Can have diarrhea up to four to five days at a time and then goes to constipation. Has tried fiber but was not consistent with it. Occasion use of antidiarrhea meds while at work. Colonoscopy up to date. Review of her history, she has had partial small bowel obstructions in the past, last imaging I could fine was in 2021 with transition point at TI, similar to 2013, 2014 studies. Patient denies having surgery for bowel obstruction or capsule study in the past. She has had numerous abdominal surgeries and is at increased risk of adhesions. She has full body MRIs yearly with her oncologist for surveillance due to her history of neurofibromatosis, and is due in near future.    PLAN   Resume pantoprazole '40mg'$  daily to every other day before breakfast.  Start benefiber 2 tsp daily, can go up to twice daily if needed.  Consider imaging of small bowel given ongoing bowel concerns and her history of recurrent small bowel obstructions at the TI. To discuss with Dr. Abbey Chatters.    Laureen Ochs. Bobby Rumpf, [place], [facility]

## 2022-04-20 NOTE — Telephone Encounter (Signed)
Lmom for pt to return my call.  

## 2022-04-20 NOTE — Patient Instructions (Signed)
Start pantoprazole '40mg'$  daily to every other day for history of Barrett's esophagus, reflux. Start fiber daily, be consistent. Try Benefiber 2 teaspoons daily for 1-2 weeks, if needed you can increase to twice daily after that. Hopefully this will even out your stools and help with shifts between constipation and diarrhea.  Continue to eat plenty of fiber in your diet, fruits/veggies.  I will be in touch regarding possible camera test of your small intestines.

## 2022-05-06 DIAGNOSIS — H5203 Hypermetropia, bilateral: Secondary | ICD-10-CM | POA: Diagnosis not present

## 2022-05-06 DIAGNOSIS — H52203 Unspecified astigmatism, bilateral: Secondary | ICD-10-CM | POA: Diagnosis not present

## 2022-05-06 DIAGNOSIS — Q8500 Neurofibromatosis, unspecified: Secondary | ICD-10-CM | POA: Diagnosis not present

## 2022-05-06 DIAGNOSIS — H02403 Unspecified ptosis of bilateral eyelids: Secondary | ICD-10-CM | POA: Diagnosis not present

## 2022-05-06 DIAGNOSIS — H5347 Heteronymous bilateral field defects: Secondary | ICD-10-CM | POA: Diagnosis not present

## 2022-05-06 DIAGNOSIS — H469 Unspecified optic neuritis: Secondary | ICD-10-CM | POA: Diagnosis not present

## 2022-05-06 DIAGNOSIS — H524 Presbyopia: Secondary | ICD-10-CM | POA: Diagnosis not present

## 2022-05-08 DIAGNOSIS — H524 Presbyopia: Secondary | ICD-10-CM | POA: Diagnosis not present

## 2022-05-11 DIAGNOSIS — K219 Gastro-esophageal reflux disease without esophagitis: Secondary | ICD-10-CM | POA: Diagnosis not present

## 2022-05-11 DIAGNOSIS — Z0001 Encounter for general adult medical examination with abnormal findings: Secondary | ICD-10-CM | POA: Diagnosis not present

## 2022-05-11 DIAGNOSIS — N390 Urinary tract infection, site not specified: Secondary | ICD-10-CM | POA: Diagnosis not present

## 2022-05-11 DIAGNOSIS — M1990 Unspecified osteoarthritis, unspecified site: Secondary | ICD-10-CM | POA: Diagnosis not present

## 2022-06-02 DIAGNOSIS — N390 Urinary tract infection, site not specified: Secondary | ICD-10-CM | POA: Diagnosis not present

## 2022-06-08 DIAGNOSIS — N390 Urinary tract infection, site not specified: Secondary | ICD-10-CM | POA: Diagnosis not present

## 2022-06-08 DIAGNOSIS — K219 Gastro-esophageal reflux disease without esophagitis: Secondary | ICD-10-CM | POA: Diagnosis not present

## 2022-06-08 DIAGNOSIS — Q8500 Neurofibromatosis, unspecified: Secondary | ICD-10-CM | POA: Diagnosis not present

## 2022-06-08 DIAGNOSIS — E785 Hyperlipidemia, unspecified: Secondary | ICD-10-CM | POA: Diagnosis not present

## 2022-06-16 ENCOUNTER — Telehealth: Payer: Self-pay | Admitting: Internal Medicine

## 2022-06-16 NOTE — Telephone Encounter (Signed)
Lmom for pt to return my call.  

## 2022-06-16 NOTE — Telephone Encounter (Signed)
Pt saw LSL on 6/5 and is still having problems. Please advise if she needs to come back in or make recommendations. [phone]

## 2022-06-17 ENCOUNTER — Telehealth: Payer: Self-pay | Admitting: Internal Medicine

## 2022-06-17 NOTE — Telephone Encounter (Signed)
Pt called wanting to know if you was able to review the whole body MRI from [facility] (looks like it was scanned in to her chart under media in June). Pt states that she is still having the same issues and is wanting to know what can be done to get her some answers.

## 2022-06-17 NOTE — Addendum Note (Signed)
Addended by: Cheron Every on: 06/17/2022 01:38 PM   Modules accepted: Orders

## 2022-06-17 NOTE — Telephone Encounter (Signed)
Pt was made aware and is agreeable with moving forward with recommended CTE.

## 2022-06-17 NOTE — Telephone Encounter (Signed)
Noted, will make patient aware.

## 2022-06-17 NOTE — Telephone Encounter (Signed)
Please apologize to patient. I was unaware the MRI records came and were scanned under media. See other phone note for recommendations.

## 2022-06-17 NOTE — Telephone Encounter (Signed)
Reviewed MRI whole body scan done at [facility], scanned into media 05/05/22 but I was not made aware until today when patient called. Also see other telephone notes from yesterday and today.  Nothing on MRI whole body scan that helps explain her ongoing symptoms.   Recommend dedicated study to look at her small bowel since she has had small bowel obstructions in the past with transition point at TI.   If she is agreeable:  CTE for dx: h/o small bowel obstruction in the past with transition at TI, intermittent diarrhea/constipation, h/o neurofibromatosis.

## 2022-06-17 NOTE — Telephone Encounter (Signed)
Pt returning call. [phone]

## 2022-06-17 NOTE — Telephone Encounter (Signed)
See other note

## 2022-06-17 NOTE — Telephone Encounter (Signed)
PA approved via [employer] for CTE. Auth# [id number], DOS: Jun 17 2022 - Jul 17 2022  CTE scheduled for 8/25, arrival 10:30am, npo 4hrs prior. Needs BUN/CREAT prior.   Called pt and she is aware of appt details. Lab orders placed. Aware needs to have done few days prior.

## 2022-06-19 ENCOUNTER — Ambulatory Visit: Payer: Medicare HMO | Admitting: Urology

## 2022-07-10 ENCOUNTER — Ambulatory Visit (HOSPITAL_COMMUNITY): Payer: Medicare HMO

## 2022-07-23 ENCOUNTER — Telehealth: Payer: Self-pay | Admitting: *Deleted

## 2022-07-23 NOTE — Patient Outreach (Signed)
  Care Coordination   07/23/2022 Name: Kristy Christian MRN: [id number] DOB: 1961-05-13   Care Coordination Outreach Attempts:  An unsuccessful telephone outreach was attempted today to offer the patient information about available care coordination services as a benefit of their health plan.   Follow Up Plan:  Additional outreach attempts will be made to offer the patient care coordination information and services.   Encounter Outcome:  No Answer  Care Coordination Interventions Activated:  No   Care Coordination Interventions:  No, not indicated    Valente David, RN, MSN, [facility] Care Management Care Management Coordinator [phone]

## 2022-07-27 ENCOUNTER — Ambulatory Visit (INDEPENDENT_AMBULATORY_CARE_PROVIDER_SITE_OTHER): Payer: Medicare HMO | Admitting: Urology

## 2022-07-27 ENCOUNTER — Encounter: Payer: Self-pay | Admitting: Urology

## 2022-07-27 VITALS — BP 115/59 | HR 86

## 2022-07-27 DIAGNOSIS — N390 Urinary tract infection, site not specified: Secondary | ICD-10-CM | POA: Diagnosis not present

## 2022-07-27 DIAGNOSIS — H029 Unspecified disorder of eyelid: Secondary | ICD-10-CM

## 2022-07-27 DIAGNOSIS — R300 Dysuria: Secondary | ICD-10-CM

## 2022-07-27 DIAGNOSIS — R3915 Urgency of urination: Secondary | ICD-10-CM

## 2022-07-27 DIAGNOSIS — R350 Frequency of micturition: Secondary | ICD-10-CM

## 2022-07-27 LAB — URINALYSIS, ROUTINE W REFLEX MICROSCOPIC
Bilirubin, UA: NEGATIVE
Glucose, UA: NEGATIVE
Ketones, UA: NEGATIVE
Nitrite, UA: POSITIVE — AB
Protein,UA: NEGATIVE
Specific Gravity, UA: 1.02 (ref 1.005–1.030)
Urobilinogen, Ur: 0.2 mg/dL (ref 0.2–1.0)
pH, UA: 5 (ref 5.0–7.5)

## 2022-07-27 LAB — MICROSCOPIC EXAMINATION: Renal Epithel, UA: NONE SEEN /hpf

## 2022-07-27 MED ORDER — TRIMETHOPRIM 100 MG PO TABS: 100.0000 mg | ORAL_TABLET | Freq: Every day | ORAL | 11 refills | Status: DC

## 2022-07-27 MED ORDER — DOXYCYCLINE HYCLATE 100 MG PO CAPS: 100.0000 mg | ORAL_CAPSULE | Freq: Two times a day (BID) | ORAL | 0 refills | Status: DC

## 2022-07-27 NOTE — Patient Instructions (Signed)
Urinary Tract Infection, Adult A urinary tract infection (UTI) is an infection of any part of the urinary tract. The urinary tract includes: The kidneys. The ureters. The bladder. The urethra. These organs make, store, and get rid of pee (urine) in the body. What are the causes? This infection is caused by germs (bacteria) in your genital area. These germs grow and cause swelling (inflammation) of your urinary tract. What increases the risk? The following factors may make you more likely to develop this condition: Using a small, thin tube (catheter) to drain pee. Not being able to control when you pee or poop (incontinence). Being female. If you are female, these things can increase the risk: Using these methods to prevent pregnancy: A medicine that kills sperm (spermicide). A device that blocks sperm (diaphragm). Having low levels of a female hormone (estrogen). Being pregnant. You are more likely to develop this condition if: You have genes that add to your risk. You are sexually active. You take antibiotic medicines. You have trouble peeing because of: A prostate that is bigger than normal, if you are female. A blockage in the part of your body that drains pee from the bladder. A kidney stone. A nerve condition that affects your bladder. Not getting enough to drink. Not peeing often enough. You have other conditions, such as: Diabetes. A weak disease-fighting system (immune system). Sickle cell disease. Gout. Injury of the spine. What are the signs or symptoms? Symptoms of this condition include: Needing to pee right away. Peeing small amounts often. Pain or burning when peeing. Blood in the pee. Pee that smells bad or not like normal. Trouble peeing. Pee that is cloudy. Fluid coming from the vagina, if you are female. Pain in the belly or lower back. Other symptoms include: Vomiting. Not feeling hungry. Feeling mixed up (confused). This may be the first symptom in  older adults. Being tired and grouchy (irritable). A fever. Watery poop (diarrhea). How is this treated? Taking antibiotic medicine. Taking other medicines. Drinking enough water. In some cases, you may need to see a specialist. Follow these instructions at home:  Medicines Take over-the-counter and prescription medicines only as told by your doctor. If you were prescribed an antibiotic medicine, take it as told by your doctor. Do not stop taking it even if you start to feel better. General instructions Make sure you: Pee until your bladder is empty. Do not hold pee for a long time. Empty your bladder after sex. Wipe from front to back after peeing or pooping if you are a female. Use each tissue one time when you wipe. Drink enough fluid to keep your pee pale yellow. Keep all follow-up visits. Contact a doctor if: You do not get better after 1-2 days. Your symptoms go away and then come back. Get help right away if: You have very bad back pain. You have very bad pain in your lower belly. You have a fever. You have chills. You feeling like you will vomit or you vomit. Summary A urinary tract infection (UTI) is an infection of any part of the urinary tract. This condition is caused by germs in your genital area. There are many risk factors for a UTI. Treatment includes antibiotic medicines. Drink enough fluid to keep your pee pale yellow. This information is not intended to replace advice given to you by your health care provider. Make sure you discuss any questions you have with your health care provider. Document Revised: 06/14/2020 Document Reviewed: 06/14/2020 Elsevier Patient Education    2023 Elsevier Inc.  

## 2022-07-27 NOTE — Progress Notes (Signed)
07/27/2022 2:09 PM   Chauntae Theda Sers 04-22-1961 [id number]  Referring provider: Carrolyn Meiers, MD [place],  [place] [id number]  Followup recurrent UTI   HPI: Kristy Christian is a 61yo here for followup for recurrent UTI. She has had 4 UTIs while on macrobid. UA today is concerning for infection. She has urinary frequency, urgency and dysuria. She is scheduled for CT abd/pelvis on wednesday   PMH: Past Medical History:  Diagnosis Date   GERD (gastroesophageal reflux disease)    IBS (irritable bowel syndrome)    Knee pain    Neurofibromatosis ([place])    UTI (lower urinary tract infection)     Surgical History: Past Surgical History:  Procedure Laterality Date   ABDOMINAL HYSTERECTOMY     BIOPSY  05/12/2021   Procedure: BIOPSY;  Surgeon: Eloise Harman, DO;  Location[facility];  Service: Endoscopy;;   BREAST LUMPECTOMY  11/17/2007   [place]   four C-section   CHOLECYSTECTOMY  11/16/2005   COLONOSCOPY WITH PROPOFOL N/A 05/12/2021   Procedure: COLONOSCOPY WITH PROPOFOL;  Surgeon: Eloise Harman, DO;  Location: [facility];  Service: Endoscopy;  Laterality: N/A;  2:45pm   ESOPHAGOGASTRODUODENOSCOPY (EGD) WITH PROPOFOL N/A 05/12/2021   Procedure: ESOPHAGOGASTRODUODENOSCOPY (EGD) WITH PROPOFOL;  Surgeon: Eloise Harman, DO;  Location: [facility];  Service: Endoscopy;  Laterality: N/A;    Home Medications:  Allergies as of 07/27/2022   No Known Allergies      Medication List        Accurate as of July 27, 2022  2:09 PM. If you have any questions, ask your nurse or doctor.          MOVE FREE PO Take by mouth 2 (two) times daily.   pantoprazole 40 MG tablet Commonly known as: PROTONIX Take 1 tablet (40 mg total) by mouth daily before breakfast.   Vitamin B-12 5000 MCG Tbdp Take by mouth daily.        Allergies: No Known Allergies  Family History: Family History   Problem Relation Age of Onset   Arthritis Mother    Cancer Mother        liver   Arthritis Father    Hypertension Father    Stroke Father    Heart attack Father    Other Other        arthralgia, multi sites   Hypertension Other    Cancer Other        breast; aunt   Colon cancer Neg Hx    Colon polyps Neg Hx     Social History:  reports that she has quit smoking. Her smoking use included cigarettes. She has a 19.50 pack-year smoking history. She has never used smokeless tobacco. She reports current alcohol use. She reports that she does not use drugs.  ROS: All other review of systems were reviewed and are negative except what is noted above in HPI  Physical Exam: BP (!) 115/59   Pulse 86   Constitutional:  Alert and oriented, No acute distress. HEENT: [place] AT, moist mucus membranes.  Trachea midline, no masses. Cardiovascular: No clubbing, cyanosis, or edema. Respiratory: Normal respiratory effort, no increased work of breathing. GI: Abdomen is soft, nontender, nondistended, no abdominal masses GU: No CVA tenderness.  Lymph: No cervical or inguinal lymphadenopathy. Skin: No rashes, bruises or suspicious lesions. Neurologic: Grossly intact, no focal deficits, moving all 4 extremities. Psychiatric: Normal  mood and affect.  Laboratory Data: Lab Results  Component Value Date   WBC 9.3 09/22/2017   HGB 13.7 09/22/2017   HCT 39.8 09/22/2017   MCV 81.9 09/22/2017   PLT 218 09/22/2017    Lab Results  Component Value Date   CREATININE 0.77 09/22/2017    No results found for: "PSA"  No results found for: "TESTOSTERONE"  No results found for: "HGBA1C"  Urinalysis    Component Value Date/Time   COLORURINE AMBER (A) 08/21/2017 1801   APPEARANCEUR Hazy (A) 11/21/2021 1147   LABSPEC 1.027 08/21/2017 1801   PHURINE 5.0 08/21/2017 1801   GLUCOSEU Negative 11/21/2021 1147   HGBUR SMALL (A) 08/21/2017 1801   BILIRUBINUR Negative 11/21/2021 1147   KETONESUR 5 (A)  08/21/2017 1801   PROTEINUR Negative 11/21/2021 1147   PROTEINUR 30 (A) 08/21/2017 1801   UROBILINOGEN 0.2 04/05/2017 1401   UROBILINOGEN 0.2 08/26/2015 1100   NITRITE Negative 11/21/2021 1147   NITRITE NEGATIVE 08/21/2017 1801   LEUKOCYTESUR 1+ (A) 11/21/2021 1147    Lab Results  Component Value Date   LABMICR See below: 11/21/2021   WBCUA >30 (A) 11/21/2021   LABEPIT >10 (A) 11/21/2021   BACTERIA Many (A) 11/21/2021    Pertinent Imaging:  No results found for this or any previous visit.  No results found for this or any previous visit.  No results found for this or any previous visit.  No results found for this or any previous visit.  No results found for this or any previous visit.  No results found for this or any previous visit.  No results found for this or any previous visit.  No results found for this or any previous visit.   Assessment & Plan:    1. Recurrent UTI -Urine for culture. We will start doxycycline '100mg'$  BID for 7 days.  - Urinalysis, Routine w reflex microscopic   No follow-ups on file.  Nicolette Bang, MD  [facility] [place]

## 2022-07-28 ENCOUNTER — Telehealth: Payer: Self-pay | Admitting: *Deleted

## 2022-07-28 NOTE — Patient Outreach (Signed)
  Care Coordination   07/28/2022 Name: Kristy Christian MRN: [id number] DOB: 03/08/1961   Care Coordination Outreach Attempts:  A second unsuccessful outreach was attempted today to offer the patient with information about available care coordination services as a benefit of their health plan.     Follow Up Plan:  Additional outreach attempts will be made to offer the patient care coordination information and services.   Encounter Outcome:  No Answer  Care Coordination Interventions Activated:  No   Care Coordination Interventions:  No, not indicated    Valente David, RN, MSN, [facility] Care Management Care Management Coordinator [phone]

## 2022-07-29 ENCOUNTER — Ambulatory Visit (HOSPITAL_COMMUNITY)
Admission: RE | Admit: 2022-07-29 | Discharge: 2022-07-29 | Disposition: A | Discharge status: Home / Self Care | Payer: Medicare HMO | Source: Ambulatory Visit | Attending: Gastroenterology | Admitting: Gastroenterology

## 2022-07-29 ENCOUNTER — Encounter (HOSPITAL_COMMUNITY): Payer: Self-pay | Admitting: Radiology

## 2022-07-29 DIAGNOSIS — R198 Other specified symptoms and signs involving the digestive system and abdomen: Secondary | ICD-10-CM | POA: Diagnosis not present

## 2022-07-29 DIAGNOSIS — Q8500 Neurofibromatosis, unspecified: Secondary | ICD-10-CM | POA: Diagnosis not present

## 2022-07-29 DIAGNOSIS — Z8719 Personal history of other diseases of the digestive system: Secondary | ICD-10-CM | POA: Diagnosis not present

## 2022-07-29 DIAGNOSIS — K838 Other specified diseases of biliary tract: Secondary | ICD-10-CM | POA: Diagnosis not present

## 2022-07-29 DIAGNOSIS — K529 Noninfective gastroenteritis and colitis, unspecified: Secondary | ICD-10-CM | POA: Diagnosis not present

## 2022-07-29 MED ORDER — IOHEXOL 300 MG/ML  SOLN
100.0000 mL | Freq: Once | INTRAMUSCULAR | Status: AC | PRN
  Administered 2022-07-29: 125 mL via INTRAVENOUS

## 2022-07-30 LAB — URINE CULTURE

## 2022-07-31 ENCOUNTER — Telehealth: Payer: Self-pay | Admitting: *Deleted

## 2022-07-31 NOTE — Patient Outreach (Signed)
  Care Coordination   Initial Visit Note   07/31/2022 Name: Anahid Eskelson MRN: [id number] DOB: Nov 14, 1961  Maribel Crammer is a 61 y.o. year old female who sees Fanta, Normajean Baxter, MD for primary care. I spoke with  Blaize Theda Sers by phone today.  What matters to the patients health and wellness today?  State she is doing well, does not feel assistance is needed at this time.  She does agree to receiving mailed information about program and will contact [facility] if needed.    SDOH assessments and interventions completed:  No     Care Coordination Interventions Activated:  No  Care Coordination Interventions:  No, not indicated   Follow up plan: No further intervention required.   Encounter Outcome:  Pt. Refused   Valente David, RN, MSN, [place] Care Management Care Management Coordinator [phone]

## 2022-08-03 ENCOUNTER — Telehealth: Payer: Self-pay

## 2022-08-03 ENCOUNTER — Other Ambulatory Visit: Payer: Self-pay | Admitting: *Deleted

## 2022-08-03 DIAGNOSIS — R198 Other specified symptoms and signs involving the digestive system and abdomen: Secondary | ICD-10-CM

## 2022-08-03 DIAGNOSIS — R9389 Abnormal findings on diagnostic imaging of other specified body structures: Secondary | ICD-10-CM

## 2022-08-03 NOTE — Telephone Encounter (Signed)
Please see result note. Needs to see surgery. Instructions in result note.

## 2022-08-03 NOTE — Telephone Encounter (Signed)
Pt called and left a message wanting to know what the next steps are. Please advise. Thanks.

## 2022-08-04 ENCOUNTER — Telehealth: Payer: Self-pay

## 2022-08-04 NOTE — Telephone Encounter (Signed)
Patient called advising she completed the CT Abdomen and Pelvis and just wanted to make you aware so you could review it as discussed at last visit.   Thank you

## 2022-08-05 DIAGNOSIS — Z6832 Body mass index (BMI) 32.0-32.9, adult: Secondary | ICD-10-CM | POA: Diagnosis not present

## 2022-08-05 DIAGNOSIS — Q8500 Neurofibromatosis, unspecified: Secondary | ICD-10-CM | POA: Diagnosis not present

## 2022-08-05 DIAGNOSIS — K5909 Other constipation: Secondary | ICD-10-CM | POA: Diagnosis not present

## 2022-08-05 DIAGNOSIS — Z299 Encounter for prophylactic measures, unspecified: Secondary | ICD-10-CM | POA: Diagnosis not present

## 2022-08-05 DIAGNOSIS — K219 Gastro-esophageal reflux disease without esophagitis: Secondary | ICD-10-CM | POA: Diagnosis not present

## 2022-08-05 DIAGNOSIS — K5090 Crohn's disease, unspecified, without complications: Secondary | ICD-10-CM | POA: Diagnosis not present

## 2022-08-17 NOTE — Telephone Encounter (Signed)
Left voicemail for patient to return call for results.

## 2022-08-19 NOTE — Telephone Encounter (Signed)
Patient informed of MD response to CT and voiced understanding.

## 2022-08-19 NOTE — Telephone Encounter (Signed)
Patient left a voice message 08-18-2022.  Returned missed call.  Please advise.   Thanks, Helene Kelp

## 2022-08-20 ENCOUNTER — Ambulatory Visit (INDEPENDENT_AMBULATORY_CARE_PROVIDER_SITE_OTHER): Payer: Medicare HMO | Admitting: General Surgery

## 2022-08-20 ENCOUNTER — Other Ambulatory Visit: Payer: Self-pay

## 2022-08-20 ENCOUNTER — Other Ambulatory Visit: Payer: Self-pay | Admitting: *Deleted

## 2022-08-20 ENCOUNTER — Encounter: Payer: Self-pay | Admitting: General Surgery

## 2022-08-20 VITALS — BP 133/82 | HR 80 | Temp 97.8°F | Resp 16 | Ht 65.0 in | Wt 200.0 lb

## 2022-08-20 DIAGNOSIS — K599 Functional intestinal disorder, unspecified: Secondary | ICD-10-CM

## 2022-08-20 NOTE — Progress Notes (Signed)
[address] [place]; [id number]; [birth year]   HPI Patient is a 61 year old white female who was referred to my care by gastroenterology for evaluation and treatment of an abnormal CAT scan of the abdomen which showed chronic progressive small bowel dilatation.  Patient states that she has had GI issues for many years.  Her past surgical history is remarkable for multiple C-sections, abdominal hysterectomy, and cholecystectomy.  She does vary between diarrhea and constipation.  She has nonspecific chronic abdominal pain which she feels is secondary to her bowels.  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  She also states she has reflux disease as well as irritable bowel syndrome. Past Medical History:  Diagnosis Date   GERD (gastroesophageal reflux disease)    IBS (irritable bowel syndrome)    Knee pain    Neurofibromatosis ([place])    UTI (lower urinary tract infection)     Past Surgical History:  Procedure Laterality Date   ABDOMINAL HYSTERECTOMY     BIOPSY  05/12/2021   Procedure: BIOPSY;  Surgeon: Eloise Harman, DO;  [facility];  Service: Endoscopy;;   BREAST LUMPECTOMY  11/17/2007   [place]   four C-section   CHOLECYSTECTOMY  11/16/2005   COLONOSCOPY WITH PROPOFOL N/A 05/12/2021   Procedure: COLONOSCOPY WITH PROPOFOL;  Surgeon: Eloise Harman, DO;  [facility];  Service: Endoscopy;  Laterality: N/A;  2:45pm   ESOPHAGOGASTRODUODENOSCOPY (EGD) WITH PROPOFOL N/A 05/12/2021   Procedure: ESOPHAGOGASTRODUODENOSCOPY (EGD) WITH PROPOFOL;  Surgeon: Eloise Harman, DO;  [facility];  Service: Endoscopy;  Laterality: N/A;    Family History  Problem Relation Age of Onset   Arthritis Mother    Cancer Mother        liver   Arthritis Father    Hypertension Father    Stroke Father    Heart attack Father    Other Other        arthralgia, multi sites   Hypertension Other    Cancer Other        breast; aunt    Colon cancer Neg Hx    Colon polyps Neg Hx     Current Outpatient Medications on File Prior to Visit  Medication Sig Dispense Refill   Cyanocobalamin (VITAMIN B-12) 5000 MCG TBDP Take by mouth daily.     Glucosamine-Chondroitin (MOVE FREE PO) Take by mouth 2 (two) times daily.     ibuprofen (ADVIL) 800 MG tablet Take 800 mg by mouth 3 (three) times daily as needed.     pantoprazole (PROTONIX) 40 MG tablet Take 1 tablet (40 mg total) by mouth daily before breakfast. 90 tablet 3   No current facility-administered medications on file prior to visit.    No Known Allergies  Social History   Substance and Sexual Activity  Alcohol Use Yes   Alcohol/week: 0.0 standard drinks of alcohol   Comment: occ    Social History   Tobacco Use  Smoking Status Former   Packs/day: 0.50   Years: 39.00   Total pack years: 19.50   Types: Cigarettes  Smokeless Tobacco Never  Tobacco Comments   quit around 2017     Review of Systems  Constitutional: Negative.   HENT: Negative.    Eyes: Negative.   Respiratory: Negative.    Cardiovascular: Negative.   Gastrointestinal:  Positive for abdominal pain and heartburn.  Musculoskeletal:  Positive for joint pain and neck pain.  Skin: Negative.   Neurological:  Negative.   Endo/Heme/Allergies: Negative.   Psychiatric/Behavioral: Negative.      Objective   Vitals:   08/20/22 0845  BP: 133/82  Pulse: 80  Resp: 16  Temp: 97.8 F (36.6 C)  SpO2: 99%    Physical Exam Vitals reviewed.  Constitutional:      Appearance: Normal appearance. She is not ill-appearing.  HENT:     Head: Normocephalic and atraumatic.  Cardiovascular:     Rate and Rhythm: Normal rate and regular rhythm.     Heart sounds: Normal heart sounds. No murmur heard.    No friction rub. No gallop.  Pulmonary:     Effort: Pulmonary effort is normal. No respiratory distress.     Breath sounds: Normal breath sounds. No stridor. No wheezing, rhonchi or rales.  Abdominal:      General: Bowel sounds are normal. There is no distension.     Palpations: Abdomen is soft.     Tenderness: There is no abdominal tenderness. There is no guarding or rebound.     Hernia: No hernia is present.  Skin:    General: Skin is warm and dry.     Findings: Lesion present.     Comments: She has extensive neurofibromas covering her total body.  Neurological:     Mental Status: She is alert and oriented to person, place, and time.     CT ENTERO ABD/PELVIS Huey Romans (Accession [id number]) (Order [id number]) Imaging Date: 07/29/2022 Department: [facility] Released By: Rosey Bath Authorizing: Mahala Menghini, PA-C   Exam Status  Status  Final [99]   PACS Intelerad Image Link   Show images for CT ENTERO ABD/PELVIS W CONTAST  Study Result  Narrative & Impression  CLINICAL DATA:  History of small-bowel obstruction with transition point in the past at the terminal ileum. This   EXAM: CT ABDOMEN AND PELVIS WITH CONTRAST (ENTEROGRAPHY)   TECHNIQUE: Multidetector CT of the abdomen and pelvis during bolus administration of intravenous contrast. Negative oral contrast was given.   RADIATION DOSE REDUCTION: This exam was performed according to the departmental dose-optimization program which includes automated exposure control, adjustment of the mA and/or kV according to patient size and/or use of iterative reconstruction technique.   CONTRAST:  157m OMNIPAQUE IOHEXOL 300 MG/ML  SOLN   COMPARISON:  June 13, 2013.   FINDINGS: Lower chest: Lung bases are clear. No effusion or consolidative process.   Hepatobiliary: Post cholecystectomy. Stable dilation of the intrahepatic biliary tree and extrahepatic biliary tree following cholecystectomy since 2014. Portal vein is patent. No focal, suspicious hepatic lesion. There is LEFT hepatic lobe atrophy that is also unchanged since 2014.   Pancreas: Mild atrophy of the pancreas. No signs of inflammation, focal  lesion or ductal dilation.   Spleen: 15 cm greatest craniocaudal dimension.   Adrenals/Urinary Tract:   Adrenal glands are unremarkable. Symmetric renal enhancement. No sign of hydronephrosis. No suspicious renal lesion or perinephric stranding.   Urinary bladder is grossly unremarkable.   Stomach/Bowel: Patulous distal esophagus is fluid-filled. Only mildly distended. Stomach mildly distended. No signs of stranding about the stomach.   Small bowel loops are dilated beginning at the level of the jejunum and extending through the terminal ileum. Gradual transition occurring in the ileum but not at the terminal ileum on the current exam. Small bowel thickening and dilation appears increased since July of 2014 but was also seen on this more remote study. There is no sign of pneumatosis. Diffuse mural stratification throughout all  loops of small bowel beginning in areas of dilated jejunum. Small amounts of perienteric fluid.   Colon is nondilated without signs of stranding or bowel thickening. Appendix is normal.   Scattered ileal diverticulosis   Small bowel loops in the jejunum dilated up to 5.5 cm when measured in the coronal plane much more dilated than on more remote imaging.   Vascular/Lymphatic:   Aortic atherosclerosis. No sign of aneurysm. Smooth contour of the IVC. There is no gastrohepatic or hepatoduodenal ligament lymphadenopathy. No retroperitoneal or mesenteric lymphadenopathy.   No pelvic sidewall lymphadenopathy.   .   Reproductive: Post hysterectomy.   Other: Enlarging neurofibromas about the body wall.   Ascites/interloop fluid about ileal loops in the anterior abdomen.   Ventral midline laxity of the suprapubic abdominal wall with small area of rectus diastasis and hernia without obstruction.   Musculoskeletal: No acute bone finding. No destructive bone process. Spinal degenerative changes.   IMPRESSION: 1. Signs of acute on chronic enteritis  with further dilation of bowel loops particularly in the jejunum compared to more remote imaging. Findings are highly suggestive of inflammatory bowel disease and are associated with small amounts of interloop fluid but no pneumatosis or free air. 2. Longstanding partial mechanical obstruction may be of exacerbated by acute enteritis leading to superimposed functional obstruction due to acute enteritis on a background of chronic low level obstruction. Distension is certainly worse than in July of 2014. Correlate with any acute changes in symptoms based on the marked dilation of bowel loops on the current study. 3. Stable dilation of the biliary tree and atrophy of the LEFT hepatic lobe since 2014. 4. Stable LEFT hepatic lobe atrophy. 5. Enlarging neurofibromas. 6. Ventral midline laxity of the suprapubic abdominal wall with small area of rectus diastasis and hernia without obstruction. 7. Aortic atherosclerosis.   Aortic Atherosclerosis (ICD10-I70.0).   These results will be called to the ordering clinician or representative by the Radiologist Assistant, and communication documented in the PACS or [employer].     Electronically Signed   By: Zetta Bills M.D.   On: 07/30/2022 11:48      Result History  CT ENTERO ABD/PELVIS Huey Romans (Order #[id number]) on 07/30/2022 - Order Result History Report   CT ENTERO ABD/PELVIS W CONTAST: Result Notes   Cheron Every, CMA  08/03/2022  2:08 PM EDT      Referral sent   [place], [facility]  08/03/2022  1:34 PM EDT      Pt was made aware and verbalized understanding. Routing to [place] to arrange referral.    Mahala Menghini, PA-C  08/03/2022  1:14 PM EDT      Sorry below had a typo, should have said suggestive of IBD, not IBS.    I discussed her CT findings and symptoms with Dr. Gala Romney. He is concerned that she is getting some symptoms related to chronic progressive partial mechanical obstruction in the small  bowel. It is not clear the underlying cause. He is recommending she see a surgeon in the next 1-2 weeks. Please make arrangements for her to see Arnoldo Morale or Constance Haw.    [place] Hong, [facility]  07/31/2022  7:36 AM EDT      Noted.    Mahala Menghini, PA-C  07/30/2022  7:14 PM EDT      Spoke with patient regarding CT findings. CT was done yesterday, we got a call report today at 4pm. Patient has been having chronic intermittent issues with  bowels, in past constipation/diarrhea, mostly constipation lately. Was taking stool softeners which were too effective. Plans to restart. She had vomiting yesterday after drinking contrast for CTE. Able to eat bland food today. Some vague abdominal discomfort.    CTE shows increased amount of small bowel thickening and dilation since 2014, particularly in the jejunum. Suggestive of IBS. Longstanding partial mechanical obstruction may be worsened in setting of acute enteritis, small bowel distention worse. Stable dilation of biliary tree and atrophy of the left hepatic lobe since 2014.    Would not recommend capsule endoscopy in this setting given risk of retained capsule. She may benefit from surgical consultation. To discuss with Dr. Gala Romney. Patient aware of warning signs for which she should go to the ED.         MyChart Results Release  MyChart Status: Pending  Results Release    Encounter-Level Documents on 07/29/2022:  Electronic signature on 07/29/2022 10:07 AM - 1 of 3 e-signatures recorded       Order-Level Documents:  There are no order-level documents.  Hospital Account-Level Documents:  There are no hospital account-level documents.  Orders Requiring a Screening Form  Procedure Order Status Form Status   CT ENTERO ABD/PELVIS W CONTAST Completed Completed    Vitals  Height Weight BMI (Calculated)       Imaging  Imaging Information    CT ENTERO ABD/PELVIS W CONTAST: Patient Communication   Add Comments   Not seen    Resulted  by:  Signed Date/Time  Phone Pager  Zetta Bills E 07/30/2022 11:48 AM [phone]    Link to IR Documentation Timeline  Sedation    CT ENTERO ABD/PELVIS W CONTAST: Result Notes   Cheron Every, CMA  08/03/2022  2:08 PM EDT      Referral sent   [place], [facility]  08/03/2022  1:34 PM EDT      Pt was made aware and verbalized understanding. Routing to [place] to arrange referral.    Mahala Menghini, PA-C  08/03/2022  1:14 PM EDT      Sorry below had a typo, should have said suggestive of IBD, not IBS.    I discussed her CT findings and symptoms with Dr. Gala Romney. He is concerned that she is getting some symptoms related to chronic progressive partial mechanical obstruction in the small bowel. It is not clear the underlying cause. He is recommending she see a surgeon in the next 1-2 weeks. Please make arrangements for her to see Arnoldo Morale or Constance Haw.    Lodema Hong, [facility]  07/31/2022  7:36 AM EDT      Noted.    Mahala Menghini, PA-C  07/30/2022  7:14 PM EDT      Spoke with patient regarding CT findings. CT was done yesterday, we got a call report today at 4pm. Patient has been having chronic intermittent issues with bowels, in past constipation/diarrhea, mostly constipation lately. Was taking stool softeners which were too effective. Plans to restart. She had vomiting yesterday after drinking contrast for CTE. Able to eat bland food today. Some vague abdominal discomfort.    CTE shows increased amount of small bowel thickening and dilation since 2014, particularly in the jejunum. Suggestive of IBS. Longstanding partial mechanical obstruction may be worsened in setting of acute enteritis, small bowel distention worse. Stable dilation of biliary tree and atrophy of the left hepatic lobe since 2014.    Would not recommend capsule endoscopy in this setting given risk of  retained capsule. She may benefit from surgical consultation. To discuss with Dr. Gala Romney. Patient aware of  warning signs for which she should go to the ED.         Reference Links  CT PROTOCOL          Study Notes     [place], Micah J, [place] on 07/29/2022 11:23 AM  h/o small bowel obstruction in past with transition at TI, intermitten diarrhea/constipation, h/o neurofibromatosis, Alternating constipation and diarrhea, Hx SBO, H/O neurofibromatosis ([place])   Pt only able to drink 1.5 bottles of contrast. Pt states she vomited after first bottle.    125ccs omni 300 given     Imaging Related Medications   Medication  iohexol (OMNIPAQUE) 300 MG/ML solution 100 mL (Completed)  Route:   Intravenous  Admin Amount:   100 mL  Volume:   100 mL  PRN Reason(s):   contrast  Last Admin Time:   07/29/22 1123  Number of Expected Doses:   1  Most Recent Administration:  User Action Time Recorded Time Dose Route Site Comment Action Reason  [place], [place] J, [place] 07/29/22 1123 07/29/22 1123 125 mL Intravenous   Contrast Given   Full Administration Report              Original Order  Ordered On Ordered By   06/17/2022  1:28 PM Cheron Every, CMA         External Result Report  External Result Report   Existing Charges  Charge Line Charge Code Status Charge Trigger Charge Type  [id number] [facility] CT Abdomen and Pelvis W Cm [[id number]] [facility] Billing Imaging end exam Technical  [id number] CT Scan,Abdoment and Pelvis,W Contrast [address] [facility]  - Radiology 3rd Party Billing Imaging result study Professional  GI notes reviewed Assessment  Chronic bowel dysmotility, question mechanical small bowel obstruction.  Neurofibromatosis. Plan  At this point, it may be difficult to a certain whether her findings are due to a mechanical issue versus a physiologic issue.  Neurofibromatosis can cause bowel dysmotility.  As this is outside my realm of expertise, I will refer her to general surgery at [facility] for further evaluation and treatment. The patient  understands and agrees.

## 2022-08-20 NOTE — Patient Instructions (Signed)
Will refer you to General Surgery at [facility]

## 2022-09-02 DIAGNOSIS — Q8500 Neurofibromatosis, unspecified: Secondary | ICD-10-CM | POA: Diagnosis not present

## 2022-09-02 DIAGNOSIS — K5090 Crohn's disease, unspecified, without complications: Secondary | ICD-10-CM | POA: Diagnosis not present

## 2022-09-02 DIAGNOSIS — N390 Urinary tract infection, site not specified: Secondary | ICD-10-CM | POA: Diagnosis not present

## 2022-09-02 DIAGNOSIS — I10 Essential (primary) hypertension: Secondary | ICD-10-CM | POA: Diagnosis not present

## 2022-09-02 DIAGNOSIS — Z299 Encounter for prophylactic measures, unspecified: Secondary | ICD-10-CM | POA: Diagnosis not present

## 2022-09-04 DIAGNOSIS — H02403 Unspecified ptosis of bilateral eyelids: Secondary | ICD-10-CM | POA: Diagnosis not present

## 2022-09-15 DIAGNOSIS — Q8500 Neurofibromatosis, unspecified: Secondary | ICD-10-CM | POA: Diagnosis not present

## 2022-09-15 DIAGNOSIS — K5090 Crohn's disease, unspecified, without complications: Secondary | ICD-10-CM | POA: Diagnosis not present

## 2022-09-28 DIAGNOSIS — Q8500 Neurofibromatosis, unspecified: Secondary | ICD-10-CM | POA: Diagnosis not present

## 2022-09-28 DIAGNOSIS — Q8501 Neurofibromatosis, type 1: Secondary | ICD-10-CM | POA: Diagnosis not present

## 2022-09-29 DIAGNOSIS — Q8500 Neurofibromatosis, unspecified: Secondary | ICD-10-CM | POA: Diagnosis not present

## 2022-09-29 DIAGNOSIS — K219 Gastro-esophageal reflux disease without esophagitis: Secondary | ICD-10-CM | POA: Diagnosis not present

## 2022-09-29 DIAGNOSIS — Z299 Encounter for prophylactic measures, unspecified: Secondary | ICD-10-CM | POA: Diagnosis not present

## 2022-09-29 DIAGNOSIS — K5090 Crohn's disease, unspecified, without complications: Secondary | ICD-10-CM | POA: Diagnosis not present

## 2022-10-20 DIAGNOSIS — R252 Cramp and spasm: Secondary | ICD-10-CM | POA: Diagnosis not present

## 2022-10-20 DIAGNOSIS — N390 Urinary tract infection, site not specified: Secondary | ICD-10-CM | POA: Diagnosis not present

## 2022-10-20 DIAGNOSIS — K5090 Crohn's disease, unspecified, without complications: Secondary | ICD-10-CM | POA: Diagnosis not present

## 2022-10-20 DIAGNOSIS — Z299 Encounter for prophylactic measures, unspecified: Secondary | ICD-10-CM | POA: Diagnosis not present

## 2022-10-26 ENCOUNTER — Ambulatory Visit: Payer: Medicaid Other | Admitting: Urology

## 2022-10-26 DIAGNOSIS — N390 Urinary tract infection, site not specified: Secondary | ICD-10-CM

## 2022-12-28 DIAGNOSIS — N390 Urinary tract infection, site not specified: Secondary | ICD-10-CM | POA: Diagnosis not present

## 2022-12-28 DIAGNOSIS — Z299 Encounter for prophylactic measures, unspecified: Secondary | ICD-10-CM | POA: Diagnosis not present

## 2022-12-28 DIAGNOSIS — K5090 Crohn's disease, unspecified, without complications: Secondary | ICD-10-CM | POA: Diagnosis not present

## 2022-12-28 DIAGNOSIS — R5383 Other fatigue: Secondary | ICD-10-CM | POA: Diagnosis not present

## 2022-12-28 DIAGNOSIS — Q8500 Neurofibromatosis, unspecified: Secondary | ICD-10-CM | POA: Diagnosis not present

## 2022-12-29 DIAGNOSIS — K838 Other specified diseases of biliary tract: Secondary | ICD-10-CM | POA: Diagnosis not present

## 2022-12-29 DIAGNOSIS — K56699 Other intestinal obstruction unspecified as to partial versus complete obstruction: Secondary | ICD-10-CM | POA: Diagnosis not present

## 2022-12-29 DIAGNOSIS — K7290 Hepatic failure, unspecified without coma: Secondary | ICD-10-CM | POA: Diagnosis not present

## 2022-12-29 DIAGNOSIS — Q8509 Other neurofibromatosis: Secondary | ICD-10-CM | POA: Diagnosis not present

## 2022-12-29 DIAGNOSIS — Q8500 Neurofibromatosis, unspecified: Secondary | ICD-10-CM | POA: Diagnosis not present

## 2022-12-29 DIAGNOSIS — Z8719 Personal history of other diseases of the digestive system: Secondary | ICD-10-CM | POA: Diagnosis not present

## 2022-12-31 DIAGNOSIS — Q8500 Neurofibromatosis, unspecified: Secondary | ICD-10-CM | POA: Diagnosis not present

## 2022-12-31 DIAGNOSIS — K580 Irritable bowel syndrome with diarrhea: Secondary | ICD-10-CM | POA: Diagnosis not present

## 2022-12-31 DIAGNOSIS — K5090 Crohn's disease, unspecified, without complications: Secondary | ICD-10-CM | POA: Diagnosis not present

## 2022-12-31 DIAGNOSIS — Z299 Encounter for prophylactic measures, unspecified: Secondary | ICD-10-CM | POA: Diagnosis not present

## 2023-01-11 DIAGNOSIS — R252 Cramp and spasm: Secondary | ICD-10-CM | POA: Diagnosis not present

## 2023-01-11 DIAGNOSIS — Q8500 Neurofibromatosis, unspecified: Secondary | ICD-10-CM | POA: Diagnosis not present

## 2023-01-11 DIAGNOSIS — N390 Urinary tract infection, site not specified: Secondary | ICD-10-CM | POA: Diagnosis not present

## 2023-01-11 DIAGNOSIS — Z299 Encounter for prophylactic measures, unspecified: Secondary | ICD-10-CM | POA: Diagnosis not present

## 2023-02-01 DIAGNOSIS — K56699 Other intestinal obstruction unspecified as to partial versus complete obstruction: Secondary | ICD-10-CM | POA: Insufficient documentation

## 2023-02-03 DIAGNOSIS — R6889 Other general symptoms and signs: Secondary | ICD-10-CM | POA: Diagnosis not present

## 2023-02-03 DIAGNOSIS — H02413 Mechanical ptosis of bilateral eyelids: Secondary | ICD-10-CM | POA: Diagnosis not present

## 2023-02-03 DIAGNOSIS — H57813 Brow ptosis, bilateral: Secondary | ICD-10-CM | POA: Diagnosis not present

## 2023-02-03 DIAGNOSIS — Q8500 Neurofibromatosis, unspecified: Secondary | ICD-10-CM | POA: Diagnosis not present

## 2023-02-03 DIAGNOSIS — D433 Neoplasm of uncertain behavior of cranial nerves: Secondary | ICD-10-CM | POA: Diagnosis not present

## 2023-02-03 DIAGNOSIS — H05313 Atrophy of bilateral orbit: Secondary | ICD-10-CM | POA: Diagnosis not present

## 2023-02-03 DIAGNOSIS — H02423 Myogenic ptosis of bilateral eyelids: Secondary | ICD-10-CM | POA: Diagnosis not present

## 2023-02-08 ENCOUNTER — Other Ambulatory Visit: Payer: Self-pay | Admitting: Internal Medicine

## 2023-02-08 DIAGNOSIS — Z1339 Encounter for screening examination for other mental health and behavioral disorders: Secondary | ICD-10-CM | POA: Diagnosis not present

## 2023-02-08 DIAGNOSIS — E2839 Other primary ovarian failure: Secondary | ICD-10-CM | POA: Diagnosis not present

## 2023-02-08 DIAGNOSIS — Z1231 Encounter for screening mammogram for malignant neoplasm of breast: Secondary | ICD-10-CM

## 2023-02-08 DIAGNOSIS — Z0000 Encounter for general adult medical examination without abnormal findings: Secondary | ICD-10-CM | POA: Diagnosis not present

## 2023-02-08 DIAGNOSIS — Z7189 Other specified counseling: Secondary | ICD-10-CM | POA: Diagnosis not present

## 2023-02-08 DIAGNOSIS — J309 Allergic rhinitis, unspecified: Secondary | ICD-10-CM | POA: Diagnosis not present

## 2023-02-08 DIAGNOSIS — Z1331 Encounter for screening for depression: Secondary | ICD-10-CM | POA: Diagnosis not present

## 2023-02-08 DIAGNOSIS — Z299 Encounter for prophylactic measures, unspecified: Secondary | ICD-10-CM | POA: Diagnosis not present

## 2023-02-08 DIAGNOSIS — R42 Dizziness and giddiness: Secondary | ICD-10-CM | POA: Diagnosis not present

## 2023-02-09 ENCOUNTER — Ambulatory Visit
Admission: RE | Admit: 2023-02-09 | Discharge: 2023-02-09 | Disposition: A | Discharge status: Home / Self Care | Payer: Medicare HMO | Source: Ambulatory Visit | Attending: Internal Medicine | Admitting: Internal Medicine

## 2023-02-09 DIAGNOSIS — Z1231 Encounter for screening mammogram for malignant neoplasm of breast: Secondary | ICD-10-CM | POA: Diagnosis not present

## 2023-02-15 DIAGNOSIS — K56699 Other intestinal obstruction unspecified as to partial versus complete obstruction: Secondary | ICD-10-CM | POA: Diagnosis not present

## 2023-02-15 DIAGNOSIS — Q8500 Neurofibromatosis, unspecified: Secondary | ICD-10-CM | POA: Diagnosis not present

## 2023-02-24 DIAGNOSIS — K5090 Crohn's disease, unspecified, without complications: Secondary | ICD-10-CM | POA: Diagnosis not present

## 2023-02-24 DIAGNOSIS — Z299 Encounter for prophylactic measures, unspecified: Secondary | ICD-10-CM | POA: Diagnosis not present

## 2023-02-24 DIAGNOSIS — K219 Gastro-esophageal reflux disease without esophagitis: Secondary | ICD-10-CM | POA: Diagnosis not present

## 2023-02-27 DIAGNOSIS — R350 Frequency of micturition: Secondary | ICD-10-CM | POA: Diagnosis not present

## 2023-02-27 DIAGNOSIS — R399 Unspecified symptoms and signs involving the genitourinary system: Secondary | ICD-10-CM | POA: Diagnosis not present

## 2023-02-28 DIAGNOSIS — R350 Frequency of micturition: Secondary | ICD-10-CM | POA: Diagnosis not present

## 2023-02-28 DIAGNOSIS — R399 Unspecified symptoms and signs involving the genitourinary system: Secondary | ICD-10-CM | POA: Diagnosis not present

## 2023-03-11 DIAGNOSIS — Z9189 Other specified personal risk factors, not elsewhere classified: Secondary | ICD-10-CM | POA: Diagnosis not present

## 2023-03-11 DIAGNOSIS — Z79899 Other long term (current) drug therapy: Secondary | ICD-10-CM | POA: Diagnosis not present

## 2023-03-11 DIAGNOSIS — Z01818 Encounter for other preprocedural examination: Secondary | ICD-10-CM | POA: Diagnosis not present

## 2023-03-11 DIAGNOSIS — K589 Irritable bowel syndrome without diarrhea: Secondary | ICD-10-CM | POA: Diagnosis not present

## 2023-03-11 DIAGNOSIS — K219 Gastro-esophageal reflux disease without esophagitis: Secondary | ICD-10-CM | POA: Diagnosis not present

## 2023-03-11 DIAGNOSIS — Q8501 Neurofibromatosis, type 1: Secondary | ICD-10-CM | POA: Insufficient documentation

## 2023-03-11 DIAGNOSIS — I10 Essential (primary) hypertension: Secondary | ICD-10-CM | POA: Diagnosis not present

## 2023-03-25 DIAGNOSIS — Z9049 Acquired absence of other specified parts of digestive tract: Secondary | ICD-10-CM | POA: Diagnosis not present

## 2023-03-25 DIAGNOSIS — Z90710 Acquired absence of both cervix and uterus: Secondary | ICD-10-CM | POA: Diagnosis not present

## 2023-03-25 DIAGNOSIS — K5989 Other specified functional intestinal disorders: Secondary | ICD-10-CM | POA: Diagnosis not present

## 2023-03-25 DIAGNOSIS — G8918 Other acute postprocedural pain: Secondary | ICD-10-CM | POA: Diagnosis not present

## 2023-03-25 DIAGNOSIS — H269 Unspecified cataract: Secondary | ICD-10-CM | POA: Diagnosis not present

## 2023-03-25 DIAGNOSIS — K56600 Partial intestinal obstruction, unspecified as to cause: Secondary | ICD-10-CM | POA: Diagnosis not present

## 2023-03-25 DIAGNOSIS — K219 Gastro-esophageal reflux disease without esophagitis: Secondary | ICD-10-CM | POA: Diagnosis not present

## 2023-03-25 DIAGNOSIS — Q8501 Neurofibromatosis, type 1: Secondary | ICD-10-CM | POA: Diagnosis not present

## 2023-03-25 DIAGNOSIS — Z87891 Personal history of nicotine dependence: Secondary | ICD-10-CM | POA: Diagnosis not present

## 2023-03-29 DIAGNOSIS — K5090 Crohn's disease, unspecified, without complications: Secondary | ICD-10-CM | POA: Diagnosis not present

## 2023-03-29 DIAGNOSIS — R12 Heartburn: Secondary | ICD-10-CM | POA: Diagnosis not present

## 2023-03-29 DIAGNOSIS — Q8500 Neurofibromatosis, unspecified: Secondary | ICD-10-CM | POA: Diagnosis not present

## 2023-03-29 DIAGNOSIS — Z299 Encounter for prophylactic measures, unspecified: Secondary | ICD-10-CM | POA: Diagnosis not present

## 2023-03-29 DIAGNOSIS — R52 Pain, unspecified: Secondary | ICD-10-CM | POA: Diagnosis not present

## 2023-04-02 DIAGNOSIS — R519 Headache, unspecified: Secondary | ICD-10-CM | POA: Diagnosis not present

## 2023-04-02 DIAGNOSIS — K5090 Crohn's disease, unspecified, without complications: Secondary | ICD-10-CM | POA: Diagnosis not present

## 2023-04-02 DIAGNOSIS — Z299 Encounter for prophylactic measures, unspecified: Secondary | ICD-10-CM | POA: Diagnosis not present

## 2023-04-05 DIAGNOSIS — Z48815 Encounter for surgical aftercare following surgery on the digestive system: Secondary | ICD-10-CM | POA: Diagnosis not present

## 2023-04-05 DIAGNOSIS — K56699 Other intestinal obstruction unspecified as to partial versus complete obstruction: Secondary | ICD-10-CM | POA: Diagnosis not present

## 2023-04-05 DIAGNOSIS — Q8500 Neurofibromatosis, unspecified: Secondary | ICD-10-CM | POA: Diagnosis not present

## 2023-04-27 DIAGNOSIS — K259 Gastric ulcer, unspecified as acute or chronic, without hemorrhage or perforation: Secondary | ICD-10-CM | POA: Diagnosis not present

## 2023-04-27 DIAGNOSIS — K529 Noninfective gastroenteritis and colitis, unspecified: Secondary | ICD-10-CM | POA: Diagnosis not present

## 2023-04-29 DIAGNOSIS — K259 Gastric ulcer, unspecified as acute or chronic, without hemorrhage or perforation: Secondary | ICD-10-CM | POA: Diagnosis not present

## 2023-04-29 DIAGNOSIS — Q8500 Neurofibromatosis, unspecified: Secondary | ICD-10-CM | POA: Diagnosis not present

## 2023-04-29 DIAGNOSIS — K529 Noninfective gastroenteritis and colitis, unspecified: Secondary | ICD-10-CM | POA: Diagnosis not present

## 2023-05-13 DIAGNOSIS — Q8500 Neurofibromatosis, unspecified: Secondary | ICD-10-CM | POA: Diagnosis not present

## 2023-05-13 DIAGNOSIS — K5090 Crohn's disease, unspecified, without complications: Secondary | ICD-10-CM | POA: Diagnosis not present

## 2023-05-13 DIAGNOSIS — M1990 Unspecified osteoarthritis, unspecified site: Secondary | ICD-10-CM | POA: Diagnosis not present

## 2023-05-13 DIAGNOSIS — N390 Urinary tract infection, site not specified: Secondary | ICD-10-CM | POA: Diagnosis not present

## 2023-05-13 DIAGNOSIS — Z299 Encounter for prophylactic measures, unspecified: Secondary | ICD-10-CM | POA: Diagnosis not present

## 2023-05-25 ENCOUNTER — Ambulatory Visit: Payer: Medicare HMO | Admitting: Gastroenterology

## 2023-05-28 DIAGNOSIS — N390 Urinary tract infection, site not specified: Secondary | ICD-10-CM | POA: Diagnosis not present

## 2023-05-28 DIAGNOSIS — M79605 Pain in left leg: Secondary | ICD-10-CM | POA: Diagnosis not present

## 2023-05-28 DIAGNOSIS — K5909 Other constipation: Secondary | ICD-10-CM | POA: Diagnosis not present

## 2023-05-28 DIAGNOSIS — M79604 Pain in right leg: Secondary | ICD-10-CM | POA: Diagnosis not present

## 2023-05-28 DIAGNOSIS — Z299 Encounter for prophylactic measures, unspecified: Secondary | ICD-10-CM | POA: Diagnosis not present

## 2023-06-01 ENCOUNTER — Ambulatory Visit: Payer: Medicare HMO | Admitting: Gastroenterology

## 2023-06-01 NOTE — Progress Notes (Deleted)
GI Office Note    Referring Provider: Kirstie Peri, MD Primary Care Physician:  Kirstie Peri, MD  Primary Gastroenterologist: Hennie Duos. Marletta Lor, DO   Chief Complaint   No chief complaint on file.   History of Present Illness   Kristy Christian is a 62 y.o. female presenting today for follow up. Last seen 04/2022. Self- Self-reported history of Barrett's.  Intermittent GERD.  History of alternating constipation and diarrhea. She also has neurofibromatosis.  Patient is followed by hematology/oncology for her neurofibromatosis yearly for whole body NF screening.    History of recurrent small bowel obstructions in 2013, 2014, 2021 which makes her no longer a candidate for small bowel capsule.   CTE 07/2022:  IMPRESSION: 1. Signs of acute on chronic enteritis with further dilation of bowel loops particularly in the jejunum compared to more remote imaging. Findings are highly suggestive of inflammatory bowel disease and are associated with small amounts of interloop fluid but no pneumatosis or free air. 2. Longstanding partial mechanical obstruction may be of exacerbated by acute enteritis leading to superimposed functional obstruction due to acute enteritis on a background of chronic low level obstruction. Distension is certainly worse than in July of 2014. Correlate with any acute changes in symptoms based on the marked dilation of bowel loops on the current study. 3. Stable dilation of the biliary tree and atrophy of the LEFT hepatic lobe since 2014. 4. Stable LEFT hepatic lobe atrophy. 5. Enlarging neurofibromas. 6. Ventral midline laxity of the suprapubic abdominal wall with small area of rectus diastasis and hernia without obstruction. 7. Aortic atherosclerosis. Colonoscopy June 2022: Nonbleeding hemorrhoids.  Next colonoscopy in 10 years.    Seen at [facility]   EGD June 2022: Barrett's esophagus with no dysplasia, small hiatal hernia, gastritis no H. pylori.   Next EGD in 3 years.   CT abdomen pelvis with contrast June 2021: Small bowel obstruction with transition point in the TI similar to 2014 in 2013 studies.    Medications   Current Outpatient Medications  Medication Sig Dispense Refill   Cyanocobalamin (VITAMIN B-12) 5000 MCG TBDP Take by mouth daily.     Glucosamine-Chondroitin (MOVE FREE PO) Take by mouth 2 (two) times daily.     ibuprofen (ADVIL) 800 MG tablet Take 800 mg by mouth 3 (three) times daily as needed.     pantoprazole (PROTONIX) 40 MG tablet Take 1 tablet (40 mg total) by mouth daily before breakfast. 90 tablet 3   No current facility-administered medications for this visit.    Allergies   Allergies as of 06/01/2023   (No Known Allergies)     Past Medical History   Past Medical History:  Diagnosis Date   GERD (gastroesophageal reflux disease)    IBS (irritable bowel syndrome)    Knee pain    Neurofibromatosis ([facility])    UTI (lower urinary tract infection)     Past Surgical History   Past Surgical History:  Procedure Laterality Date   ABDOMINAL HYSTERECTOMY     BIOPSY  05/12/2021   Procedure: BIOPSY;  Surgeon: Lanelle Bal, DO;  [facility];  Service: Endoscopy;;   BREAST LUMPECTOMY  11/17/2007   BREAST SURGERY     CESAREAN SECTION  1980 and 1982   four C-section   CHOLECYSTECTOMY  11/16/2005   COLONOSCOPY WITH PROPOFOL N/A 05/12/2021   Procedure: COLONOSCOPY WITH PROPOFOL;  Surgeon: Lanelle Bal, DO;  [facility];  Service: Endoscopy;  Laterality: N/A;  2:45pm   ESOPHAGOGASTRODUODENOSCOPY (EGD) WITH PROPOFOL N/A 05/12/2021   Procedure: ESOPHAGOGASTRODUODENOSCOPY (EGD) WITH PROPOFOL;  Surgeon: Lanelle Bal, DO;  [facility];  Service: Endoscopy;  Laterality: N/A;    Past Family History   Family History  Problem Relation Age of Onset   Arthritis Mother    Cancer Mother        liver   Arthritis Father    Hypertension Father    Stroke Father     Heart attack Father    Other Other        arthralgia, multi sites   Hypertension Other    Cancer Other        breast; aunt   Colon cancer Neg Hx    Colon polyps Neg Hx     Past Social History   Social History   Socioeconomic History   Marital status: Divorced    Spouse name: Not on file   Number of children: Not on file   Years of education: Not on file   Highest education level: Not on file  Occupational History   Not on file  Tobacco Use   Smoking status: Former    Current packs/day: 0.50    Average packs/day: 0.5 packs/day for 39.0 years (19.5 ttl pk-yrs)    Types: Cigarettes   Smokeless tobacco: Never   Tobacco comments:    quit around 2017   Vaping Use   Vaping status: Never Used  Substance and Sexual Activity   Alcohol use: Yes    Alcohol/week: 0.0 standard drinks of alcohol    Comment: occ   Drug use: No    Types: "Crack" cocaine    Comment: none since 1988   Sexual activity: Yes    Birth control/protection: Surgical  Other Topics Concern   Not on file  Social History Narrative   Not on file   Social Determinants of Health   Financial Resource Strain: Not on file  Food Insecurity: Low Risk  (03/25/2023)   [facility]   Food vital sign    Within the past 12 months, you worried that your food would run out before you got money to buy more: Never true    Within the past 12 months, the food you bought just didn't last and you didn't have money to get more. : Never true  Transportation Needs: No Transportation Needs (03/25/2023)   [facility]   Transportation    In the past 12 months, has lack of reliable transportation kept you from medical appointments, meetings, work or from getting things needed for daily living? : No  Physical Activity: Not on file  Stress: Not on file  Social Connections: Unknown (10/14/2022)   Received from [facility]   Social Network    Social Network: Not on file   Intimate Partner Violence: Unknown (10/14/2022)   Received from [facility]   HITS    Physically Hurt: Not on file    Insult or Talk Down To: Not on file    Threaten Physical Harm: Not on file    Scream or Curse: Not on file    Review of Systems   General: Negative for anorexia, weight loss, fever, chills, fatigue, weakness. ENT: Negative for hoarseness, difficulty swallowing , nasal congestion. CV: Negative for chest pain, angina, palpitations, dyspnea on exertion, peripheral edema.  Respiratory: Negative for dyspnea at rest, dyspnea on exertion, cough, sputum, wheezing.  GI: See history of present illness.  GU:  Negative for dysuria, hematuria, urinary incontinence, urinary frequency, nocturnal urination.  Endo: Negative for unusual weight change.     Physical Exam   There were no vitals taken for this visit.   General: Well-nourished, well-developed in no acute distress.  Eyes: No icterus. Mouth: Oropharyngeal mucosa moist and pink , no lesions erythema or exudate. Lungs: Clear to auscultation bilaterally.  Heart: Regular rate and rhythm, no murmurs rubs or gallops.  Abdomen: Bowel sounds are normal, nontender, nondistended, no hepatosplenomegaly or masses,  no abdominal bruits or hernia , no rebound or guarding.  Rectal: ***  Extremities: No lower extremity edema. No clubbing or deformities. Neuro: Alert and oriented x 4   Skin: Warm and dry, no jaundice.   Psych: Alert and cooperative, normal mood and affect.  Labs   *** Imaging Studies   No results found.  Assessment       PLAN   ***   Leanna Battles. Melvyn Neth, MHS, PA-C [facility]

## 2023-06-02 ENCOUNTER — Encounter: Payer: Self-pay | Admitting: Gastroenterology

## 2023-06-03 DIAGNOSIS — Z79899 Other long term (current) drug therapy: Secondary | ICD-10-CM | POA: Diagnosis not present

## 2023-06-03 DIAGNOSIS — Z299 Encounter for prophylactic measures, unspecified: Secondary | ICD-10-CM | POA: Diagnosis not present

## 2023-06-03 DIAGNOSIS — Z0000 Encounter for general adult medical examination without abnormal findings: Secondary | ICD-10-CM | POA: Diagnosis not present

## 2023-06-03 DIAGNOSIS — R5383 Other fatigue: Secondary | ICD-10-CM | POA: Diagnosis not present

## 2023-06-03 DIAGNOSIS — K5090 Crohn's disease, unspecified, without complications: Secondary | ICD-10-CM | POA: Diagnosis not present

## 2023-06-10 DIAGNOSIS — H02423 Myogenic ptosis of bilateral eyelids: Secondary | ICD-10-CM | POA: Diagnosis not present

## 2023-06-10 DIAGNOSIS — R6889 Other general symptoms and signs: Secondary | ICD-10-CM | POA: Diagnosis not present

## 2023-06-11 ENCOUNTER — Ambulatory Visit: Payer: Medicare HMO | Admitting: Gastroenterology

## 2023-06-11 NOTE — Progress Notes (Deleted)
GI Office Note    Referring Provider: Kirstie Peri, MD Primary Care Physician:  Kirstie Peri, MD  Primary Gastroenterologist:  Chief Complaint   No chief complaint on file.   History of Present Illness   Kristy Christian is a 62 y.o. female presenting today  for follow up. Last seen 04/2022. Self-reported history of Barrett's.  Intermittent GERD.  History of alternating constipation and diarrhea. She also has neurofibromatosis.  Patient is followed by hematology/oncology for her neurofibromatosis yearly for whole body NF screening.      History of recurrent small bowel obstructions in 2013, 2014, 2021 which makes her no longer a candidate for small bowel capsule.    CTE 07/2022:  IMPRESSION: 1. Signs of acute on chronic enteritis with further dilation of bowel loops particularly in the jejunum compared to more remote imaging. Findings are highly suggestive of inflammatory bowel disease and are associated with small amounts of interloop fluid but no pneumatosis or free air. 2. Longstanding partial mechanical obstruction may be of exacerbated by acute enteritis leading to superimposed functional obstruction due to acute enteritis on a background of chronic low level obstruction. Distension is certainly worse than in July of 2014. Correlate with any acute changes in symptoms based on the marked dilation of bowel loops on the current study. 3. Stable dilation of the biliary tree and atrophy of the LEFT hepatic lobe since 2014. 4. Stable LEFT hepatic lobe atrophy. 5. Enlarging neurofibromas. 6. Ventral midline laxity of the suprapubic abdominal wall with small area of rectus diastasis and hernia without obstruction. 7. Aortic atherosclerosis.  Colonoscopy June 2022: Nonbleeding hemorrhoids.  Next colonoscopy in 10 years.     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 Kaylyn Lim  Indication Stricture of small intestine (CMS/HCC), Neurofibromatosis, unspecified (CMS/HCC)  Medications None  Preprocedure A history and physical has been performed, and patient medication allergies have been reviewed. The patient's tolerance of previous anesthesia has been reviewed. The risks and benefits of the procedure and the sedation options and risks were discussed with the patient. All questions were answered and informed consent obtained.  Details of the Procedure Swallowed: Gastrointestinal (GI) tract imaging from inside the lumen of the intestinal tract was done in a non-invasive manner by capsule endoscopy, allowing to directly view the small intestine.  The patient was required to fast for 10 hours before the procedure.  The patient swallowed the capsule in the outpatient setting. Color video images from inside the GI tract were recorded as the natural peristaltic movement passed the capsule smoothly and painlessly through the radioimages to the data recorder that was secured in a pouch on a belt worn around the patient's abdomen/waist, while the patient went about daily ambulatory activities. The following day, the patient returned the equipment for processing at the computer workstation.  Gastric transit time: 1 minute Small bowel transit time: 10 hours and 31 minutes  Limited views of the esophagus were unrevealing.  Mild erythema and erosions noted in stomach  In the mid-distal small bowel, there was scattered punctate erythema throughout with more confluent areas or erythema in the distal bowel.  The capsule was  able to pass into the cecum although the small bowel transit time was  prolonged. No obvious stricture or polypoid lesion.  There was a significant amount of stool present in the distal third of the small bowel which obscured mucosal views somewhat  Colonic mucosal views limited by the presence of solid and semisolid stool        Gastric Erosions                            Enteritis 09:36:45                      Complications None  Colon Reached Yes  Post Procedure Diagnosis Distal enteritis, no evidence of small bowel neurofibromatosis  Referring Provider Marney Doctor, MD     EGD June 2022: Barrett's esophagus with no dysplasia, small hiatal hernia, gastritis no H. pylori.  Next EGD in 3 years.   CT abdomen pelvis with contrast June 2021: Small bowel obstruction with transition point in the TI similar to 2014 in 2013 studies.       MRE 12/2022: 1.  Findings suggestive of predominantly chronic appearing small bowel inflammation with a mild acute component involving long segments of mid to distal small bowel as detailed above. No abscess or penetrating disease.  2.  Long segment stricture involves the distal ileum leading into the right lower quadrant with mild proximal luminal distention, although decreased in degree compared to prior. This finding likely relates to the documented history of recurrent partial small bowel obstructions.  3.  Similar left hepatic lobe atrophy with associated biliary ductal dilatation, reportedly chronic from outside imaging reports dating back to 2014.  4.  Similar cutaneous manifestations of neurofibromatosis.   Medications   Current Outpatient Medications  Medication Sig Dispense Refill   Cyanocobalamin (VITAMIN B-12) 5000 MCG TBDP Take by mouth daily.     Glucosamine-Chondroitin (MOVE FREE PO) Take by mouth 2 (two) times daily.     ibuprofen (ADVIL) 800 MG tablet Take 800 mg by mouth 3 (three) times daily as needed.     pantoprazole (PROTONIX) 40 MG tablet Take 1 tablet (40 mg total) by mouth daily  before breakfast. 90 tablet 3   No current facility-administered medications for this visit.    Allergies   Allergies as of 06/11/2023   (No Known Allergies)     Past Medical History   Past Medical History:  Diagnosis Date   GERD (gastroesophageal reflux disease)    IBS (irritable bowel syndrome)    Knee pain    Neurofibromatosis (HCC)    UTI (lower urinary tract infection)     Past Surgical History   Past Surgical History:  Procedure Laterality Date   ABDOMINAL HYSTERECTOMY     BIOPSY  05/12/2021   Procedure: BIOPSY;  Surgeon: Lanelle Bal, DO;  Location: [facility];  Service: Endoscopy;;   BREAST LUMPECTOMY  11/17/2007   BREAST SURGERY     CESAREAN SECTION  1980 and 1982   four C-section   CHOLECYSTECTOMY  11/16/2005   COLONOSCOPY WITH PROPOFOL N/A 05/12/2021   Procedure: COLONOSCOPY WITH PROPOFOL;  Surgeon: Lanelle Bal, DO;  [facility];  Service: Endoscopy;  Laterality: N/A;  2:45pm   ESOPHAGOGASTRODUODENOSCOPY (EGD) WITH PROPOFOL N/A 05/12/2021   Procedure: ESOPHAGOGASTRODUODENOSCOPY (EGD) WITH PROPOFOL;  Surgeon: Lanelle Bal, DO;  [facility];  Service: Endoscopy;  Laterality: N/A;    Past Family  History   Family History  Problem Relation Age of Onset   Arthritis Mother    Cancer Mother        liver   Arthritis Father    Hypertension Father    Stroke Father    Heart attack Father    Other Other        arthralgia, multi sites   Hypertension Other    Cancer Other        breast; aunt   Colon cancer Neg Hx    Colon polyps Neg Hx     Past Social History   Social History   Socioeconomic History   Marital status: Divorced    Spouse name: Not on file   Number of children: Not on file   Years of education: Not on file   Highest education level: Not on file  Occupational History   Not on file  Tobacco Use   Smoking status: Former    Current packs/day: 0.50    Average packs/day: 0.5 packs/day for 39.0  years (19.5 ttl pk-yrs)    Types: Cigarettes   Smokeless tobacco: Never   Tobacco comments:    quit around 2017   Vaping Use   Vaping status: Never Used  Substance and Sexual Activity   Alcohol use: Yes    Alcohol/week: 0.0 standard drinks of alcohol    Comment: occ   Drug use: No    Types: "Crack" cocaine    Comment: none since 1988   Sexual activity: Yes    Birth control/protection: Surgical  Other Topics Concern   Not on file  Social History Narrative   Not on file   Social Determinants of Health   Financial Resource Strain: Not on file  Food Insecurity: Low Risk  (03/25/2023)   [facility]   Food vital sign    Within the past 12 months, you worried that your food would run out before you got money to buy more: Never true    Within the past 12 months, the food you bought just didn't last and you didn't have money to get more. : Never true  Transportation Needs: No Transportation Needs (03/25/2023)   [facility]   Transportation    In the past 12 months, has lack of reliable transportation kept you from medical appointments, meetings, work or from getting things needed for daily living? : No  Physical Activity: Not on file  Stress: Not on file  Social Connections: Unknown (10/14/2022)   [facility]   Social Network    Social Network: Not on file  Intimate Partner Violence: Unknown (10/14/2022)   Received from [facility]   HITS    Physically Hurt: Not on file    Insult or Talk Down To: Not on file    Threaten Physical Harm: Not on file    Scream or Curse: Not on file    Review of Systems   General: Negative for anorexia, weight loss, fever, chills, fatigue, weakness. ENT: Negative for hoarseness, difficulty swallowing , nasal congestion. CV: Negative for chest pain, angina, palpitations, dyspnea on exertion, peripheral edema.  Respiratory: Negative for dyspnea at rest, dyspnea on  exertion, cough, sputum, wheezing.  GI: See history of present illness. GU:  Negative for dysuria, hematuria, urinary incontinence, urinary frequency, nocturnal urination.  Endo: Negative for unusual weight change.     Physical Exam   There were no vitals taken for this visit.   General:  Well-nourished, well-developed in no acute distress.  Eyes: No icterus. Mouth: Oropharyngeal mucosa moist and pink , no lesions erythema or exudate. Lungs: Clear to auscultation bilaterally.  Heart: Regular rate and rhythm, no murmurs rubs or gallops.  Abdomen: Bowel sounds are normal, nontender, nondistended, no hepatosplenomegaly or masses,  no abdominal bruits or hernia , no rebound or guarding.  Rectal: ***  Extremities: No lower extremity edema. No clubbing or deformities. Neuro: Alert and oriented x 4   Skin: Warm and dry, no jaundice.   Psych: Alert and cooperative, normal mood and affect.  Labs   *** Imaging Studies   No results found.  Assessment       PLAN   ***   Leanna Battles. Melvyn Neth, MHS, PA-C [facility]

## 2023-06-17 DIAGNOSIS — R29898 Other symptoms and signs involving the musculoskeletal system: Secondary | ICD-10-CM | POA: Diagnosis not present

## 2023-06-17 DIAGNOSIS — Q8500 Neurofibromatosis, unspecified: Secondary | ICD-10-CM | POA: Diagnosis not present

## 2023-06-17 DIAGNOSIS — M79605 Pain in left leg: Secondary | ICD-10-CM | POA: Diagnosis not present

## 2023-06-17 DIAGNOSIS — M79604 Pain in right leg: Secondary | ICD-10-CM | POA: Diagnosis not present

## 2023-06-24 DIAGNOSIS — R6889 Other general symptoms and signs: Secondary | ICD-10-CM | POA: Diagnosis not present

## 2023-06-29 DIAGNOSIS — H6122 Impacted cerumen, left ear: Secondary | ICD-10-CM | POA: Diagnosis not present

## 2023-06-29 DIAGNOSIS — R635 Abnormal weight gain: Secondary | ICD-10-CM | POA: Diagnosis not present

## 2023-06-29 DIAGNOSIS — Z299 Encounter for prophylactic measures, unspecified: Secondary | ICD-10-CM | POA: Diagnosis not present

## 2023-06-29 DIAGNOSIS — M5441 Lumbago with sciatica, right side: Secondary | ICD-10-CM | POA: Diagnosis not present

## 2023-07-06 DIAGNOSIS — H6122 Impacted cerumen, left ear: Secondary | ICD-10-CM | POA: Diagnosis not present

## 2023-07-06 DIAGNOSIS — Z299 Encounter for prophylactic measures, unspecified: Secondary | ICD-10-CM | POA: Diagnosis not present

## 2023-07-07 DIAGNOSIS — M79662 Pain in left lower leg: Secondary | ICD-10-CM | POA: Diagnosis not present

## 2023-07-07 DIAGNOSIS — M79661 Pain in right lower leg: Secondary | ICD-10-CM | POA: Diagnosis not present

## 2023-07-07 DIAGNOSIS — R2689 Other abnormalities of gait and mobility: Secondary | ICD-10-CM | POA: Diagnosis not present

## 2023-07-07 DIAGNOSIS — R531 Weakness: Secondary | ICD-10-CM | POA: Diagnosis not present

## 2023-07-07 DIAGNOSIS — Z789 Other specified health status: Secondary | ICD-10-CM | POA: Diagnosis not present

## 2023-07-11 NOTE — Progress Notes (Unsigned)
GI Office Note    Referring Provider: Kirstie Peri, MD Primary Care Physician:  Kirstie Peri, MD  Primary Gastroenterologist:  Chief Complaint   No chief complaint on file.   History of Present Illness   Kristy Christian is a 62 y.o. female presenting today for follow up. Last seen 04/2022. She has history of Barrett's, GERD, IBS, with alternating constipation/diarrhea, neurofibromatosis. She is followed by hematology/oncology for her neurofibromatosis yearly for whole body NF screening. H/o recurrent SBO in 2013, 2014, 2021.   Scheduled to see Dr. Grayling Congress, GI, at [facility] 07/30/23 for follow up capsule study. Extensive work up as outlined below.    Small bowel capsule study 04/2023: Impression: distal enteritis, no evidence of small bowel neurofibromatosis., gastric erosion.   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  MRE 12/2022 1.  Findings suggestive of predominantly chronic appearing small bowel inflammation with a mild acute component involving long segments of mid to distal small bowel as detailed above. No abscess or penetrating disease.  2.  Long segment stricture involves the distal ileum leading into the right lower quadrant with mild proximal luminal distention, although decreased in degree compared to prior. This finding likely relates to the documented history of recurrent partial small bowel obstructions.  3.  Similar left hepatic lobe atrophy with associated biliary ductal dilatation, reportedly chronic from outside imaging reports dating back to 2014.  4.  Similar cutaneous manifestations of neurofibromatosis.   CTE 07/2022 IMPRESSION: 1. Signs of acute on chronic enteritis with further dilation of bowel loops particularly in the jejunum compared to more remote imaging. Findings are highly suggestive of  inflammatory bowel disease and are associated with small amounts of interloop fluid but no pneumatosis or free air. 2. Longstanding partial mechanical obstruction may be of exacerbated by acute enteritis leading to superimposed functional obstruction due to acute enteritis on a background of chronic low level obstruction. Distension is certainly worse than in July of 2014. Correlate with any acute changes in symptoms based on the marked dilation of bowel loops on the current study. 3. Stable dilation of the biliary tree and atrophy of the LEFT hepatic lobe since 2014. 4. Stable LEFT hepatic lobe atrophy. 5. Enlarging neurofibromas. 6. Ventral midline laxity of the suprapubic abdominal wall with small area of rectus diastasis and hernia without obstruction. 7. Aortic atherosclerosis.   Colonoscopy 04/2021: nonbleeding internal hemorrhoids. Next colonoscopy 10 years.   EGD 04/2021: Barrett's esophagus with no dysplasia, small hh, gastritis, no H.pylori, next EGD 3 years.   CT A/P with contrast 04/2020: small bowel obstruction with transition point in TI similar to 2013, 2014 studies.    Medications   Current Outpatient Medications  Medication Sig Dispense Refill   Cyanocobalamin (VITAMIN B-12) 5000 MCG TBDP Take by mouth daily.     Glucosamine-Chondroitin (MOVE FREE PO) Take by mouth 2 (two) times daily.     ibuprofen (ADVIL) 800 MG tablet Take 800 mg by mouth 3 (three) times daily as needed.     pantoprazole (PROTONIX) 40 MG tablet Take 1 tablet (40 mg total) by mouth daily before breakfast. 90 tablet 3   No current facility-administered medications for this visit.    Allergies   Allergies as of 07/12/2023   (No Known Allergies)     Past Medical History   Past Medical History:  Diagnosis  Date   GERD (gastroesophageal reflux disease)    IBS (irritable bowel syndrome)    Knee pain    Neurofibromatosis (HCC)    UTI (lower urinary tract infection)     Past Surgical  History   Past Surgical History:  Procedure Laterality Date   ABDOMINAL HYSTERECTOMY     BIOPSY  05/12/2021   Procedure: BIOPSY;  Surgeon: Lanelle Bal, DO;  [facility];  Service: Endoscopy;;   BREAST LUMPECTOMY  11/17/2007   BREAST SURGERY     CESAREAN SECTION  1980 and 1982   four C-section   CHOLECYSTECTOMY  11/16/2005   COLONOSCOPY WITH PROPOFOL N/A 05/12/2021   Procedure: COLONOSCOPY WITH PROPOFOL;  Surgeon: Lanelle Bal, DO;  [facility];  Service: Endoscopy;  Laterality: N/A;  2:45pm   ESOPHAGOGASTRODUODENOSCOPY (EGD) WITH PROPOFOL N/A 05/12/2021   Procedure: ESOPHAGOGASTRODUODENOSCOPY (EGD) WITH PROPOFOL;  Surgeon: Lanelle Bal, DO;  [facility];  Service: Endoscopy;  Laterality: N/A;    Past Family History   Family History  Problem Relation Age of Onset   Arthritis Mother    Cancer Mother        liver   Arthritis Father    Hypertension Father    Stroke Father    Heart attack Father    Other Other        arthralgia, multi sites   Hypertension Other    Cancer Other        breast; aunt   Colon cancer Neg Hx    Colon polyps Neg Hx     Past Social History   Social History   Socioeconomic History   Marital status: Divorced    Spouse name: Not on file   Number of children: Not on file   Years of education: Not on file   Highest education level: Not on file  Occupational History   Not on file  Tobacco Use   Smoking status: Former    Current packs/day: 0.50    Average packs/day: 0.5 packs/day for 39.0 years (19.5 ttl pk-yrs)    Types: Cigarettes   Smokeless tobacco: Never   Tobacco comments:    quit around 2017   Vaping Use   Vaping status: Never Used  Substance and Sexual Activity   Alcohol use: Yes    Alcohol/week: 0.0 standard drinks of alcohol    Comment: occ   Drug use: No    Types: "Crack" cocaine    Comment: none since 1988   Sexual activity: Yes    Birth control/protection: Surgical  Other  Topics Concern   Not on file  Social History Narrative   Not on file   Social Determinants of Health   Financial Resource Strain: Not on file  Food Insecurity: Low Risk  (03/25/2023)   [facility]   Food vital sign    Within the past 12 months, you worried that your food would run out before you got money to buy more: Never true    Within the past 12 months, the food you bought just didn't last and you didn't have money to get more. : Never true  Transportation Needs: No Transportation Needs (03/25/2023)   [facility]   Transportation    In the past 12 months, has lack of reliable transportation kept you from medical appointments, meetings, work or from getting things needed for daily living? : No  Physical Activity: Not on file  Stress:  Not on file  Social Connections: Unknown (10/14/2022)   Received from [facility]   Social Network    Social Network: Not on file  Intimate Partner Violence: Unknown (10/14/2022)   Received from [facility]   HITS    Physically Hurt: Not on file    Insult or Talk Down To: Not on file    Threaten Physical Harm: Not on file    Scream or Curse: Not on file    Review of Systems   General: Negative for anorexia, weight loss, fever, chills, fatigue, weakness. ENT: Negative for hoarseness, difficulty swallowing , nasal congestion. CV: Negative for chest pain, angina, palpitations, dyspnea on exertion, peripheral edema.  Respiratory: Negative for dyspnea at rest, dyspnea on exertion, cough, sputum, wheezing.  GI: See history of present illness. GU:  Negative for dysuria, hematuria, urinary incontinence, urinary frequency, nocturnal urination.  Endo: Negative for unusual weight change.     Physical Exam   There were no vitals taken for this visit.   General: Well-nourished, well-developed in no acute distress.  Eyes: No icterus. Mouth: Oropharyngeal mucosa moist and pink , no lesions  erythema or exudate. Lungs: Clear to auscultation bilaterally.  Heart: Regular rate and rhythm, no murmurs rubs or gallops.  Abdomen: Bowel sounds are normal, nontender, nondistended, no hepatosplenomegaly or masses,  no abdominal bruits or hernia , no rebound or guarding.  Rectal: ***  Extremities: No lower extremity edema. No clubbing or deformities. Neuro: Alert and oriented x 4   Skin: Warm and dry, no jaundice.   Psych: Alert and cooperative, normal mood and affect.  Labs   02/2023:  sodium 139, K 4.4, Creatinine 082, albumin 4.2, Tbili 0.3, AP 68, AST 12, ALT 10, WBC 6000, Hgb 12.7, platelets 214.   Imaging Studies   No results found.  Assessment       PLAN   ***   Leanna Battles. Melvyn Neth, MHS, PA-C [facility]

## 2023-07-12 ENCOUNTER — Encounter: Payer: Self-pay | Admitting: Gastroenterology

## 2023-07-12 ENCOUNTER — Ambulatory Visit (INDEPENDENT_AMBULATORY_CARE_PROVIDER_SITE_OTHER): Payer: Medicare HMO | Admitting: Gastroenterology

## 2023-07-12 VITALS — BP 136/73 | HR 98 | Temp 97.8°F | Ht 65.0 in | Wt 191.2 lb

## 2023-07-12 DIAGNOSIS — K582 Mixed irritable bowel syndrome: Secondary | ICD-10-CM | POA: Diagnosis not present

## 2023-07-12 DIAGNOSIS — K2270 Barrett's esophagus without dysplasia: Secondary | ICD-10-CM

## 2023-07-12 DIAGNOSIS — K2100 Gastro-esophageal reflux disease with esophagitis, without bleeding: Secondary | ICD-10-CM

## 2023-07-12 MED ORDER — DICYCLOMINE HCL 10 MG PO CAPS: 10.0000 mg | ORAL_CAPSULE | Freq: Three times a day (TID) | ORAL | 1 refills | Status: DC

## 2023-07-12 NOTE — Patient Instructions (Addendum)
With onset of abdominal pain, try taking dicyclomine. You can take up to four times daily as needed but only take with abdominal pain and diarrhea as it can worsen your constipation.  Unfortunately, your insurance does not cover for the one that dissolves on your tongue. Please try to keep appointment with general GI with [facility] to follow-up on abnormal small bowel capsule results. Please call my CMA, [name] at [phone] with an update after you have seen them and after trying new medication.

## 2023-07-14 DIAGNOSIS — Q8501 Neurofibromatosis, type 1: Secondary | ICD-10-CM | POA: Diagnosis not present

## 2023-07-23 DIAGNOSIS — N390 Urinary tract infection, site not specified: Secondary | ICD-10-CM | POA: Diagnosis not present

## 2023-07-23 DIAGNOSIS — M461 Sacroiliitis, not elsewhere classified: Secondary | ICD-10-CM | POA: Diagnosis not present

## 2023-07-23 DIAGNOSIS — R3989 Other symptoms and signs involving the genitourinary system: Secondary | ICD-10-CM | POA: Diagnosis not present

## 2023-07-23 DIAGNOSIS — Z299 Encounter for prophylactic measures, unspecified: Secondary | ICD-10-CM | POA: Diagnosis not present

## 2023-07-23 DIAGNOSIS — M2550 Pain in unspecified joint: Secondary | ICD-10-CM | POA: Diagnosis not present

## 2023-08-12 DIAGNOSIS — M79652 Pain in left thigh: Secondary | ICD-10-CM | POA: Diagnosis not present

## 2023-08-12 DIAGNOSIS — M545 Low back pain, unspecified: Secondary | ICD-10-CM | POA: Diagnosis not present

## 2023-08-12 DIAGNOSIS — Q8500 Neurofibromatosis, unspecified: Secondary | ICD-10-CM | POA: Diagnosis not present

## 2023-08-19 DIAGNOSIS — M79605 Pain in left leg: Secondary | ICD-10-CM | POA: Diagnosis not present

## 2023-08-19 DIAGNOSIS — M5136 Other intervertebral disc degeneration, lumbar region with discogenic back pain only: Secondary | ICD-10-CM | POA: Diagnosis not present

## 2023-08-19 DIAGNOSIS — M4316 Spondylolisthesis, lumbar region: Secondary | ICD-10-CM | POA: Diagnosis not present

## 2023-08-19 DIAGNOSIS — M48061 Spinal stenosis, lumbar region without neurogenic claudication: Secondary | ICD-10-CM | POA: Diagnosis not present

## 2023-08-24 DIAGNOSIS — Q8501 Neurofibromatosis, type 1: Secondary | ICD-10-CM | POA: Diagnosis not present

## 2023-08-25 DIAGNOSIS — R6889 Other general symptoms and signs: Secondary | ICD-10-CM | POA: Diagnosis not present

## 2023-08-26 DIAGNOSIS — M79605 Pain in left leg: Secondary | ICD-10-CM | POA: Diagnosis not present

## 2023-08-26 DIAGNOSIS — M79604 Pain in right leg: Secondary | ICD-10-CM | POA: Diagnosis not present

## 2023-08-26 DIAGNOSIS — M545 Low back pain, unspecified: Secondary | ICD-10-CM | POA: Diagnosis not present

## 2023-08-26 DIAGNOSIS — R8290 Unspecified abnormal findings in urine: Secondary | ICD-10-CM | POA: Diagnosis not present

## 2023-08-26 DIAGNOSIS — M461 Sacroiliitis, not elsewhere classified: Secondary | ICD-10-CM | POA: Diagnosis not present

## 2023-08-26 DIAGNOSIS — Z299 Encounter for prophylactic measures, unspecified: Secondary | ICD-10-CM | POA: Diagnosis not present

## 2023-08-26 DIAGNOSIS — M5441 Lumbago with sciatica, right side: Secondary | ICD-10-CM | POA: Diagnosis not present

## 2023-08-26 DIAGNOSIS — M159 Polyosteoarthritis, unspecified: Secondary | ICD-10-CM | POA: Diagnosis not present

## 2023-09-10 DIAGNOSIS — K5090 Crohn's disease, unspecified, without complications: Secondary | ICD-10-CM | POA: Diagnosis not present

## 2023-09-10 DIAGNOSIS — R1110 Vomiting, unspecified: Secondary | ICD-10-CM | POA: Diagnosis not present

## 2023-09-10 DIAGNOSIS — Z299 Encounter for prophylactic measures, unspecified: Secondary | ICD-10-CM | POA: Diagnosis not present

## 2023-09-10 DIAGNOSIS — R197 Diarrhea, unspecified: Secondary | ICD-10-CM | POA: Diagnosis not present

## 2023-09-10 DIAGNOSIS — N390 Urinary tract infection, site not specified: Secondary | ICD-10-CM | POA: Diagnosis not present

## 2023-10-25 NOTE — Progress Notes (Unsigned)
Name: Kristy Christian DOB: 02/23/1961 MRN: [id number]  History of Present Illness: Kristy Christian is a 62 y.o. female who presents today for follow up visit at [facility]. - GU history: 1. Recurrent UTI. - ***No urine culture results in past 12 months found per chart review.  At last visit with Dr. Ronne Binning on 07/27/2022: - Treated empirically with Doxycycline 100mg  BID for 7 days for UTI symptoms. - Urine culture came back positive for Klebsiella pneumoniae.  Since last visit: - 07/29/2022: CT abdomen/pelvis w/ contrast showed no GU stones, masses, or hydronephrosis; bladder unremarkable.  Today: She reports ***suspected UTI. She {Actions; denies-reports:120008} increased urinary urgency, frequency, dysuria, gross hematuria, straining to void, or sensations of incomplete emptying.  She {Actions; denies-reports:120008} flank pain or abdominal pain. She {Actions; denies-reports:120008} fevers, nausea, or vomiting.   Fall Screening: Do you usually have a device to assist in your mobility? {yes/no:20286} ***cane / ***walker / ***wheelchair  Medications: Current Outpatient Medications  Medication Sig Dispense Refill   Cyanocobalamin (VITAMIN B-12) 5000 MCG TBDP Take by mouth daily.     dicyclomine (BENTYL) 10 MG capsule Take 1 capsule (10 mg total) by mouth 4 (four) times daily -  before meals and at bedtime. As needed for abdominal pain, diarrhea 60 capsule 1   gabapentin (NEURONTIN) 100 MG capsule Take 100 mg by mouth 2 (two) times daily.     Glucosamine-Chondroitin (MOVE FREE PO) Take by mouth 2 (two) times daily.     ibuprofen (ADVIL) 800 MG tablet Take 800 mg by mouth 3 (three) times daily as needed.     LINZESS 145 MCG CAPS capsule Take 145 mcg by mouth daily.     pantoprazole (PROTONIX) 40 MG tablet Take 1 tablet (40 mg total) by mouth daily before breakfast. 90 tablet 3   No current facility-administered medications for this visit.    Allergies: No Known  Allergies  Past Medical History:  Diagnosis Date   GERD (gastroesophageal reflux disease)    IBS (irritable bowel syndrome)    Knee pain    Neurofibromatosis (HCC)    UTI (lower urinary tract infection)    Past Surgical History:  Procedure Laterality Date   ABDOMINAL HYSTERECTOMY     BIOPSY  05/12/2021   Procedure: BIOPSY;  Surgeon: Lanelle Bal, DO;  [facility];  Service: Endoscopy;;   BREAST LUMPECTOMY  11/17/2007   BREAST SURGERY     CESAREAN SECTION  1980 and 1982   four C-section   CHOLECYSTECTOMY  11/16/2005   COLONOSCOPY WITH PROPOFOL N/A 05/12/2021   Procedure: COLONOSCOPY WITH PROPOFOL;  Surgeon: Lanelle Bal, DO;  [facility];  Service: Endoscopy;  Laterality: N/A;  2:45pm   ESOPHAGOGASTRODUODENOSCOPY (EGD) WITH PROPOFOL N/A 05/12/2021   Procedure: ESOPHAGOGASTRODUODENOSCOPY (EGD) WITH PROPOFOL;  Surgeon: Lanelle Bal, DO;  [facility];  Service: Endoscopy;  Laterality: N/A;   Family History  Problem Relation Age of Onset   Arthritis Mother    Cancer Mother        liver   Arthritis Father    Hypertension Father    Stroke Father    Heart attack Father    Other Other        arthralgia, multi sites   Hypertension Other    Cancer Other        breast; aunt   Colon cancer Neg Hx    Colon polyps Neg Hx    Social History   Socioeconomic History  Marital status: Divorced    Spouse name: Not on file   Number of children: Not on file   Years of education: Not on file   Highest education level: Not on file  Occupational History   Not on file  Tobacco Use   Smoking status: Former    Current packs/day: 0.50    Average packs/day: 0.5 packs/day for 39.0 years (19.5 ttl pk-yrs)    Types: Cigarettes   Smokeless tobacco: Never   Tobacco comments:    quit around 2017   Vaping Use   Vaping status: Never Used  Substance and Sexual Activity   Alcohol use: Yes    Alcohol/week: 0.0 standard drinks of alcohol     Comment: occ   Drug use: No    Types: "Crack" cocaine    Comment: none since 1988   Sexual activity: Yes    Birth control/protection: Surgical  Other Topics Concern   Not on file  Social History Narrative   Not on file   Social Determinants of Health   Financial Resource Strain: Not on file  Food Insecurity: Low Risk  (03/25/2023)   [facility]   Hunger Vital Sign    Worried About Running Out of Food in the Last Year: Never true    Ran Out of Food in the Last Year: Never true  Transportation Needs: No Transportation Needs (03/25/2023)   [facility]   Transportation    In the past 12 months, has lack of reliable transportation kept you from medical appointments, meetings, work or from getting things needed for daily living? : No  Physical Activity: Not on file  Stress: Not on file  Social Connections: Unknown (10/14/2022)   [facility]   Social Network    Social Network: Not on file  Intimate Partner Violence: Unknown (10/14/2022)   Received from [employer], [facility]   HITS    Physically Hurt: Not on file    Insult or Talk Down To: Not on file    Threaten Physical Harm: Not on file    Scream or Curse: Not on file    Review of Systems*** Constitutional: Patient denies any unintentional weight loss or change in strength lntegumentary: Patient denies any rashes or pruritus Cardiovascular: Patient denies chest pain or syncope Respiratory: Patient denies shortness of breath Gastrointestinal: Patient ***denies nausea, vomiting, constipation, or diarrhea Musculoskeletal: Patient denies muscle cramps or weakness Neurologic: Patient denies convulsions or seizures Allergic/Immunologic: Patient denies recent allergic reaction(s) Hematologic/Lymphatic: Patient denies bleeding tendencies Endocrine: Patient denies heat/cold intolerance  GU: As per HPI.  OBJECTIVE There were no vitals  filed for this visit. There is no height or weight on file to calculate BMI.  Physical Examination*** Constitutional: No obvious distress; patient is non-toxic appearing  Cardiovascular: No visible lower extremity edema.  Respiratory: The patient does not have audible wheezing/stridor; respirations do not appear labored  Gastrointestinal: Abdomen non-distended Musculoskeletal: Normal ROM of UEs  Skin: No obvious rashes/open sores  Neurologic: CN 2-12 grossly intact Psychiatric: Answered questions appropriately with normal affect  Hematologic/Lymphatic/Immunologic: No obvious bruises or sites of spontaneous bleeding  UA: ***negative *** WBC/hpf, *** RBC/hpf, *** bacteria ***with no evidence of UTI ***with no evidence of microscopic hematuria PVR: *** ml  ASSESSMENT No diagnosis found. ***  Will plan for follow up in *** months / ***1 year or sooner if needed. Pt verbalized understanding and agreement. All questions were answered.  PLAN Advised  the following: 1. *** 2. ***No follow-ups on file.  No orders of the defined types were placed in this encounter.   It has been explained that the patient is to follow regularly with their PCP in addition to all other providers involved in their care and to follow instructions provided by these respective offices. Patient advised to contact urology clinic if any urologic-pertaining questions, concerns, new symptoms or problems arise in the interim period.  There are no Patient Instructions on file for this visit.  Electronically signed by:  Donnita Falls, FNP   10/25/23    1:59 PM

## 2023-10-27 ENCOUNTER — Ambulatory Visit: Payer: Medicare HMO | Admitting: Urology

## 2023-10-27 DIAGNOSIS — Z87440 Personal history of urinary (tract) infections: Secondary | ICD-10-CM

## 2023-12-02 ENCOUNTER — Ambulatory Visit: Payer: Medicare HMO | Admitting: Urology

## 2023-12-22 DIAGNOSIS — Z299 Encounter for prophylactic measures, unspecified: Secondary | ICD-10-CM | POA: Diagnosis not present

## 2023-12-22 DIAGNOSIS — M1990 Unspecified osteoarthritis, unspecified site: Secondary | ICD-10-CM | POA: Diagnosis not present

## 2023-12-22 DIAGNOSIS — R5383 Other fatigue: Secondary | ICD-10-CM | POA: Diagnosis not present

## 2023-12-28 DIAGNOSIS — M79672 Pain in left foot: Secondary | ICD-10-CM | POA: Diagnosis not present

## 2023-12-28 DIAGNOSIS — Z299 Encounter for prophylactic measures, unspecified: Secondary | ICD-10-CM | POA: Diagnosis not present

## 2023-12-28 DIAGNOSIS — R52 Pain, unspecified: Secondary | ICD-10-CM | POA: Diagnosis not present

## 2024-01-15 DIAGNOSIS — A084 Viral intestinal infection, unspecified: Secondary | ICD-10-CM | POA: Diagnosis not present

## 2024-02-10 DIAGNOSIS — R5383 Other fatigue: Secondary | ICD-10-CM | POA: Diagnosis not present

## 2024-02-10 DIAGNOSIS — Z789 Other specified health status: Secondary | ICD-10-CM | POA: Diagnosis not present

## 2024-02-10 DIAGNOSIS — Z79899 Other long term (current) drug therapy: Secondary | ICD-10-CM | POA: Diagnosis not present

## 2024-02-10 DIAGNOSIS — Z0000 Encounter for general adult medical examination without abnormal findings: Secondary | ICD-10-CM | POA: Diagnosis not present

## 2024-02-10 DIAGNOSIS — Z7189 Other specified counseling: Secondary | ICD-10-CM | POA: Diagnosis not present

## 2024-02-10 DIAGNOSIS — Z299 Encounter for prophylactic measures, unspecified: Secondary | ICD-10-CM | POA: Diagnosis not present

## 2024-02-15 DIAGNOSIS — R221 Localized swelling, mass and lump, neck: Secondary | ICD-10-CM | POA: Diagnosis not present

## 2024-03-14 DIAGNOSIS — M79672 Pain in left foot: Secondary | ICD-10-CM | POA: Diagnosis not present

## 2024-03-14 DIAGNOSIS — R609 Edema, unspecified: Secondary | ICD-10-CM | POA: Diagnosis not present

## 2024-03-14 DIAGNOSIS — Z299 Encounter for prophylactic measures, unspecified: Secondary | ICD-10-CM | POA: Diagnosis not present

## 2024-03-27 DIAGNOSIS — R601 Generalized edema: Secondary | ICD-10-CM | POA: Diagnosis not present

## 2024-03-31 DIAGNOSIS — K589 Irritable bowel syndrome without diarrhea: Secondary | ICD-10-CM | POA: Diagnosis not present

## 2024-03-31 DIAGNOSIS — K2270 Barrett's esophagus without dysplasia: Secondary | ICD-10-CM | POA: Diagnosis not present

## 2024-03-31 DIAGNOSIS — I890 Lymphedema, not elsewhere classified: Secondary | ICD-10-CM | POA: Diagnosis not present

## 2024-03-31 DIAGNOSIS — Z299 Encounter for prophylactic measures, unspecified: Secondary | ICD-10-CM | POA: Diagnosis not present

## 2024-04-20 ENCOUNTER — Encounter (INDEPENDENT_AMBULATORY_CARE_PROVIDER_SITE_OTHER): Payer: Self-pay | Admitting: *Deleted

## 2024-05-10 DIAGNOSIS — R52 Pain, unspecified: Secondary | ICD-10-CM | POA: Diagnosis not present

## 2024-05-10 DIAGNOSIS — M79605 Pain in left leg: Secondary | ICD-10-CM | POA: Diagnosis not present

## 2024-05-10 DIAGNOSIS — Z299 Encounter for prophylactic measures, unspecified: Secondary | ICD-10-CM | POA: Diagnosis not present

## 2024-07-07 DIAGNOSIS — Z789 Other specified health status: Secondary | ICD-10-CM | POA: Diagnosis not present

## 2024-07-07 DIAGNOSIS — Z0000 Encounter for general adult medical examination without abnormal findings: Secondary | ICD-10-CM | POA: Diagnosis not present

## 2024-07-07 DIAGNOSIS — K2270 Barrett's esophagus without dysplasia: Secondary | ICD-10-CM | POA: Diagnosis not present

## 2024-07-07 DIAGNOSIS — Z299 Encounter for prophylactic measures, unspecified: Secondary | ICD-10-CM | POA: Diagnosis not present

## 2024-07-13 DIAGNOSIS — H40033 Anatomical narrow angle, bilateral: Secondary | ICD-10-CM | POA: Diagnosis not present

## 2024-07-13 DIAGNOSIS — H5203 Hypermetropia, bilateral: Secondary | ICD-10-CM | POA: Diagnosis not present

## 2024-07-19 ENCOUNTER — Encounter (INDEPENDENT_AMBULATORY_CARE_PROVIDER_SITE_OTHER): Payer: Self-pay | Admitting: *Deleted

## 2024-07-19 DIAGNOSIS — Z87891 Personal history of nicotine dependence: Secondary | ICD-10-CM | POA: Diagnosis not present

## 2024-07-19 DIAGNOSIS — S32020A Wedge compression fracture of second lumbar vertebra, initial encounter for closed fracture: Secondary | ICD-10-CM | POA: Diagnosis not present

## 2024-07-19 DIAGNOSIS — S32029A Unspecified fracture of second lumbar vertebra, initial encounter for closed fracture: Secondary | ICD-10-CM | POA: Diagnosis not present

## 2024-07-19 DIAGNOSIS — M4856XA Collapsed vertebra, not elsewhere classified, lumbar region, initial encounter for fracture: Secondary | ICD-10-CM | POA: Diagnosis not present

## 2024-07-19 DIAGNOSIS — X58XXXA Exposure to other specified factors, initial encounter: Secondary | ICD-10-CM | POA: Diagnosis not present

## 2024-07-21 DIAGNOSIS — R52 Pain, unspecified: Secondary | ICD-10-CM | POA: Diagnosis not present

## 2024-07-21 DIAGNOSIS — M549 Dorsalgia, unspecified: Secondary | ICD-10-CM | POA: Diagnosis not present

## 2024-07-21 DIAGNOSIS — Z299 Encounter for prophylactic measures, unspecified: Secondary | ICD-10-CM | POA: Diagnosis not present

## 2024-07-28 DIAGNOSIS — Z299 Encounter for prophylactic measures, unspecified: Secondary | ICD-10-CM | POA: Diagnosis not present

## 2024-07-28 DIAGNOSIS — M549 Dorsalgia, unspecified: Secondary | ICD-10-CM | POA: Diagnosis not present

## 2024-07-28 DIAGNOSIS — R52 Pain, unspecified: Secondary | ICD-10-CM | POA: Diagnosis not present

## 2024-08-07 DIAGNOSIS — R350 Frequency of micturition: Secondary | ICD-10-CM | POA: Diagnosis not present

## 2024-08-07 DIAGNOSIS — R52 Pain, unspecified: Secondary | ICD-10-CM | POA: Diagnosis not present

## 2024-08-07 DIAGNOSIS — Z299 Encounter for prophylactic measures, unspecified: Secondary | ICD-10-CM | POA: Diagnosis not present

## 2024-08-15 ENCOUNTER — Other Ambulatory Visit: Payer: Self-pay | Admitting: Internal Medicine

## 2024-08-15 DIAGNOSIS — Z1231 Encounter for screening mammogram for malignant neoplasm of breast: Secondary | ICD-10-CM

## 2024-08-22 ENCOUNTER — Ambulatory Visit
Admission: RE | Admit: 2024-08-22 | Discharge: 2024-08-22 | Disposition: A | Discharge status: Home / Self Care | Source: Ambulatory Visit | Attending: Family Medicine | Admitting: Family Medicine

## 2024-08-22 DIAGNOSIS — Z1231 Encounter for screening mammogram for malignant neoplasm of breast: Secondary | ICD-10-CM

## 2024-08-25 DIAGNOSIS — Z9889 Other specified postprocedural states: Secondary | ICD-10-CM | POA: Diagnosis not present

## 2024-08-25 DIAGNOSIS — C7230 Malignant neoplasm of unspecified optic nerve: Secondary | ICD-10-CM | POA: Diagnosis not present

## 2024-08-25 DIAGNOSIS — H02423 Myogenic ptosis of bilateral eyelids: Secondary | ICD-10-CM | POA: Diagnosis not present

## 2024-08-25 DIAGNOSIS — C7231 Malignant neoplasm of right optic nerve: Secondary | ICD-10-CM | POA: Diagnosis not present

## 2024-08-25 DIAGNOSIS — H2513 Age-related nuclear cataract, bilateral: Secondary | ICD-10-CM | POA: Diagnosis not present

## 2024-09-29 ENCOUNTER — Ambulatory Visit: Admitting: Gastroenterology

## 2024-11-21 ENCOUNTER — Telehealth: Payer: Self-pay | Admitting: *Deleted

## 2024-11-21 ENCOUNTER — Encounter: Payer: Self-pay | Admitting: Gastroenterology

## 2024-11-21 ENCOUNTER — Ambulatory Visit (INDEPENDENT_AMBULATORY_CARE_PROVIDER_SITE_OTHER): Admitting: Gastroenterology

## 2024-11-21 VITALS — BP 126/76 | HR 94 | Temp 97.7°F | Ht 65.0 in | Wt 170.8 lb

## 2024-11-21 DIAGNOSIS — K582 Mixed irritable bowel syndrome: Secondary | ICD-10-CM | POA: Diagnosis not present

## 2024-11-21 DIAGNOSIS — Q8501 Neurofibromatosis, type 1: Secondary | ICD-10-CM

## 2024-11-21 DIAGNOSIS — K529 Noninfective gastroenteritis and colitis, unspecified: Secondary | ICD-10-CM | POA: Insufficient documentation

## 2024-11-21 DIAGNOSIS — K219 Gastro-esophageal reflux disease without esophagitis: Secondary | ICD-10-CM

## 2024-11-21 DIAGNOSIS — K2270 Barrett's esophagus without dysplasia: Secondary | ICD-10-CM | POA: Diagnosis not present

## 2024-11-21 NOTE — Progress Notes (Signed)
 "    GI Office Note    Referring Provider: Leavy Waddell NOVAK, FNP Primary Care Physician:  Leavy Waddell NOVAK, FNP  Primary Gastroenterologist: Carlin POUR. Cindie, DO   Chief Complaint   Chief Complaint  Patient presents with   Colonoscopy    Colonoscopy screening     History of Present Illness   Kristy Christian is a 64 y.o. female presenting today for follow up. Last seen 06/2023. She has h/o  Barrett's, GERD, IBS, with alternating constipation/diarrhea, neurofibromatosis (NF1). In the past she was followed by hematology/oncology for her neurofibromatosis yearly for whole body NF screening but patient reports she has not been since 2024, does not feel she can continue trips to [facility] and voices frustration that they would not agree for her scans to be done locally. She had her PCP arrange for her last scans. H/o recurrent SBO in 2013, 2014, 2021.    History of recurrent small bowel obstructions with abnormal imaging of the mid to distal small bowel on several occasions.  See detailed workup below.  She has had chronic appearing small bowel inflammation involving long segments of the mid to distal small bowel and long segment stricture involving distal ileum with mild proximal luminal distention on MRE February  2024.  Diagnostic laparoscopy in May 2024 with lysis of adhesions however no evidence of small bowel stricture or focal segment of abnormality, small bowel was ran 3 times, once with additional surgeon brought in during surgery for second opinion.  Subsequent small bowel capsule study with no evidence of small bowel neurofibromatosis but she did have distal enteritis.     After her last visit in 06/2023 with me, she was  scheduled to see Dr. Marya, GI, at [facility] 07/30/23 for follow up capsule study (distal enteritis). I don't see that she followed through with this. She was last seen by her oncologist in 06/2023.   Discussed the use of AI scribe software for clinical  note transcription with the patient, who gave verbal consent to proceed.  History of Present Illness Patient desires EGD and colonoscopy. She is overdue for surveillance EGD for Barrett's. She continues to have same bowel concern. She alternates between constipation and diarrhea. She did not find Linzess helpful so stopped it. She rather take natural products to help with her stools. Currently having BM every 1-3 days. Often influenced by her diet or work scheduled. She states she works way more than she should. Sometimes loose. Stomach feels bubbly and makes a lot of noise. She swings from loose stool to constipation, no happy  medium. No melena, brbpr. No abdominal pain. Reflux doing ok. No dysphagia.    She uses vitamin B12 intermittently and Aleve rarely as needed for pain.       Prior Data     Results   Labs from March 15, 2024: White blood cell count 5.8, hemoglobin 13, platelets 244, creatinine 0.75, total bilirubin 0.4, alk phos 76, AST 9, ALT 10  CT abdomen pelvis without contrast 07/19/2024: 1.    Acute superior endplate fracture at L2 with mild loss of height centrally, no bony retropulsion.  2.    Mild generalized thickening of the distal small bowel wall, nonspecific appearance may indicate infectious or inflammatory enteritis.  3.    Additional chronic changes as above.    Small bowel [facility] Impression: distal enteritis, no evidence of small bowel neurofibromatosis., gastric erosion.      03/2023: diagnostic lap with  lysis of adhesions, due to long segment stricture of distal small bowel on imaging but no evidence of stricture or focal segment of abnormality, diffuse hyperemia.     MRE 12/2022 1.  Findings suggestive of predominantly chronic appearing small bowel inflammation with a mild acute component involving long segments of mid to distal small bowel as detailed above. No abscess or penetrating disease.  2.  Long segment stricture involves the  distal ileum leading into the right lower quadrant with mild proximal luminal distention, although decreased in degree compared to prior. This finding likely relates to the documented history of recurrent partial small bowel obstructions.  3.  Similar left hepatic lobe atrophy with associated biliary ductal dilatation, reportedly chronic from outside imaging reports dating back to 2014.  4.  Similar cutaneous manifestations of neurofibromatosis.    CTE 07/2022 IMPRESSION: 1. Signs of acute on chronic enteritis with further dilation of bowel loops particularly in the jejunum compared to more remote imaging. Findings are highly suggestive of inflammatory bowel disease and are associated with small amounts of interloop fluid but no pneumatosis or free air. 2. Longstanding partial mechanical obstruction may be of exacerbated by acute enteritis leading to superimposed functional obstruction due to acute enteritis on a background of chronic low level obstruction. Distension is certainly worse than in July of 2014. Correlate with any acute changes in symptoms based on the marked dilation of bowel loops on the current study. 3. Stable dilation of the biliary tree and atrophy of the LEFT hepatic lobe since 2014. 4. Stable LEFT hepatic lobe atrophy. 5. Enlarging neurofibromas. 6. Ventral midline laxity of the suprapubic abdominal wall with small area of rectus diastasis and hernia without obstruction. 7. Aortic atherosclerosis.   Colonoscopy 04/2021: nonbleeding internal hemorrhoids. Next colonoscopy 10 years.    EGD 04/2021: Barrett's esophagus with no dysplasia, small hh, gastritis, no H.pylori, next EGD 3 years.    CT A/P with contrast 04/2020: small bowel obstruction with transition point in TI similar to 2013, 2014 studies.      Medications   Current Outpatient Medications  Medication Sig Dispense Refill   Naproxen Sodium (ALEVE) 220 MG CAPS Take by mouth.     pantoprazole  (PROTONIX ) 40  MG tablet Take 1 tablet (40 mg total) by mouth daily before breakfast. 90 tablet 3   No current facility-administered medications for this visit.    Allergies   Allergies as of 11/21/2024   (No Known Allergies)     Past Medical History   Past Medical History:  Diagnosis Date   GERD (gastroesophageal reflux disease)    IBS (irritable bowel syndrome)    Knee pain    Neurofibromatosis (HCC)    UTI (lower urinary tract infection)     Past Surgical History   Past Surgical History:  Procedure Laterality Date   ABDOMINAL HYSTERECTOMY     BIOPSY  05/12/2021   Procedure: BIOPSY;  Surgeon: Cindie Carlin POUR, DO;  [facility];  Service: Endoscopy;;   BREAST LUMPECTOMY  11/17/2007   BREAST SURGERY     CESAREAN SECTION  1980 and 1982   four C-section   CHOLECYSTECTOMY  11/16/2005   COLONOSCOPY WITH PROPOFOL  N/A 05/12/2021   Procedure: COLONOSCOPY WITH PROPOFOL ;  Surgeon: Cindie Carlin POUR, DO;  [facility];  Service: Endoscopy;  Laterality: N/A;  2:45pm   ESOPHAGOGASTRODUODENOSCOPY (EGD) WITH PROPOFOL  N/A 05/12/2021   Procedure: ESOPHAGOGASTRODUODENOSCOPY (EGD) WITH PROPOFOL ;  Surgeon: Cindie Carlin POUR, DO;  [facility];  Service:  Endoscopy;  Laterality: N/A;    Past Family History   Family History  Problem Relation Age of Onset   Arthritis Mother    Cancer Mother        liver   Arthritis Father    Hypertension Father    Stroke Father    Heart attack Father    Other Other        arthralgia, multi sites   Hypertension Other    Cancer Other        breast; aunt   Colon cancer Neg Hx    Colon polyps Neg Hx     Past Social History   Social History   Socioeconomic History   Marital status: Divorced    Spouse name: Not on file   Number of children: Not on file   Years of education: Not on file   Highest education level: Not on file  Occupational History   Not on file  Tobacco Use   Smoking status: Former    Current packs/day:  0.50    Average packs/day: 0.5 packs/day for 39.0 years (19.5 ttl pk-yrs)    Types: Cigarettes   Smokeless tobacco: Never   Tobacco comments:    quit around 2017   Vaping Use   Vaping status: Never Used  Substance and Sexual Activity   Alcohol  use: Yes    Alcohol /week: 0.0 standard drinks of alcohol     Comment: occ   Drug use: No    Types: Crack cocaine    Comment: none since 1988   Sexual activity: Yes    Birth control/protection: Surgical  Other Topics Concern   Not on file  Social History Narrative   Not on file   Social Drivers of Health   Tobacco Use: Medium Risk (11/21/2024)   Patient History    Smoking Tobacco Use: Former    Smokeless Tobacco Use: Never    Passive Exposure: Not on Actuary Strain: Not on file  Food Insecurity: Low Risk (03/25/2023)   Received [facility]   Epic    Within the past 12 months, you worried that your food would run out before you got money to buy more: Never true    Within the past 12 months, the food you bought just didn't last and you didn't have money to get more. : Never true  Transportation Needs: No Transportation Needs (03/25/2023)   Received from [facility]    In the past 12 months, has lack of reliable transportation kept you from medical appointments, meetings, work or from getting things needed for daily living? : No  Physical Activity: Not on file  Stress: Not on file  Social Connections: Unknown (10/14/2022)   Received from [facility]   Social Network    Social Network: Not on file  Intimate Partner Violence: Unknown (10/14/2022)   Received from [facility]   HITS    Physically Hurt: Not on file    Insult or Talk Down To: Not on file    Threaten Physical Harm: Not on file    Scream or Curse: Not on file  Depression (EYV7-0): Not on file  Alcohol  Screen: Not on file  Housing: Low Risk (03/25/2023)   Received from [facility]   Epic    What is your living situation  today?: I have a steady place to live    Think about the place you live. Do you have problems with any of the following? Choose all that  apply:: None/None on this list  Utilities: Low Risk (03/25/2023)   Received from [facility]   Utilities    In the past 12 months has the electric, gas, oil, or water  company threatened to shut off services in your home? : No  Health Literacy: Not on file    Review of Systems   General: Negative for anorexia, weight loss, fever, chills, fatigue, weakness. ENT: Negative for hoarseness, difficulty swallowing , nasal congestion. CV: Negative for chest pain, angina, palpitations, dyspnea on exertion, peripheral edema.  Respiratory: Negative for dyspnea at rest, dyspnea on exertion, cough, sputum, wheezing.  GI: See history of present illness. GU:  Negative for dysuria, hematuria, urinary incontinence, urinary frequency, nocturnal urination.  Endo: Negative for unusual weight change.     Physical Exam   BP 126/76 (BP Location: Right Arm, Patient Position: Sitting, Cuff Size: Large)   Pulse 94   Temp 97.7 F (36.5 C) (Temporal)   Ht 5' 5 (1.651 m)   Wt 170 lb 12.8 oz (77.5 kg)   BMI 28.42 kg/m    General: Well-nourished, well-developed in no acute distress.  Eyes: No icterus. Mouth: Oropharyngeal mucosa moist and pink   Lungs: Clear to auscultation bilaterally.  Heart: Regular rate and rhythm, no murmurs rubs or gallops.  Abdomen: Bowel sounds are normal, nontender, nondistended, no hepatosplenomegaly or masses,  no abdominal bruits or hernia , no rebound or guarding.  Rectal: not performed Extremities: No lower extremity edema. No clubbing or deformities. Neuro: Alert and oriented x 4   Skin: Warm and dry, no jaundice.  Numerous neurofibromas. Psych: Alert and cooperative, normal mood and affect.  Labs   See above  Imaging Studies   No results found.  Assessment/Plan:    Assessment & Plan    GERD/Barrett's esophagus Chronic  condition requiring routine surveillance; due for follow-up endoscopy. - Scheduled upper endoscopy for surveillance. ASA 2.  I have discussed the risks, alternatives, benefits with regards to but not limited to the risk of reaction to medication, bleeding, infection, perforation and the patient is agreeable to proceed. Written consent to be obtained. -continue pantoprazole  40mg  daily   Alternating constipation/diarrhea in the setting of recurrent bowel obstructions, chronic abnormal distal small bowel on numerous imaging studies (as recently as 07/2024) and on capsule endoscopy. Prior history of IBS. She had exploratory surgery in 03/2023 due to suspect long segment small bowel strictures but none was found. Currently having BM every 1-3 days. Does not want medication. Using natural approach for bowels. She is requesting colonoscopy due to bowel issues and history of distal small bowel enteritis, having not followed up after capsule endoscopy as outlined above. Abnormality present on latest imaging in 07/2024. -colonoscopy with terminal ileoscopy with small bowel biopsies if needed -ASA 2.  I have discussed the risks, alternatives, benefits with regards to but not limited to the risk of reaction to medication, bleeding, infection, perforation and the patient is agreeable to proceed. Written consent to be obtained.            Sonny RAMAN. Ezzard, MHS, PA-C [facility]  "

## 2024-11-21 NOTE — Patient Instructions (Signed)
 Colonoscopy and upper endoscopy to be scheduled.   Continue pantoprazole  40mg  daily before breakfast.  Encourage continued follow up with oncology at [facility] for NF1 surveillance.

## 2024-11-21 NOTE — Telephone Encounter (Signed)
 LMOVM to return call  TCS asa 2 w/Dr.Carver Bisacodyl 10 mg daily x 2 days before bowel prep

## 2024-11-29 ENCOUNTER — Encounter: Payer: Self-pay | Admitting: *Deleted

## 2024-11-29 NOTE — Telephone Encounter (Signed)
 LMOVM to return call. Letter mailed  Pt needs TCS/EGD w/Dr.Carver, asa 2 Bisacodyl 10 mg daily x 2 days before bowel prep

## 2024-11-30 ENCOUNTER — Encounter: Payer: Self-pay | Admitting: *Deleted

## 2024-11-30 MED ORDER — PEG 3350-KCL-NA BICARB-NACL 420 G PO SOLR: 4000.0000 mL | Freq: Once | ORAL | 0 refills | Status: AC

## 2024-11-30 NOTE — Addendum Note (Signed)
 Addended by: GAYLENE MADELIN CROME on: 11/30/2024 10:13 AM   Modules accepted: Orders

## 2024-11-30 NOTE — Telephone Encounter (Signed)
 Pt has been scheduled for 01/05/25 instructions mailed and prep sent to pharmacy.

## 2025-01-01 ENCOUNTER — Encounter (HOSPITAL_COMMUNITY)

## 2025-01-05 ENCOUNTER — Encounter (HOSPITAL_COMMUNITY): Payer: Self-pay

## 2025-01-05 ENCOUNTER — Ambulatory Visit (HOSPITAL_COMMUNITY): Admit: 2025-01-05 | Admitting: Internal Medicine

## 2025-01-05 SURGERY — COLONOSCOPY
Anesthesia: Choice
